# Patient Record
Sex: Female | Born: 1955 | Race: Black or African American | Hispanic: No | Marital: Single | Smoking: Never smoker
Health system: Southern US, Community
[De-identification: ages and names within clinical notes are randomized; demographics above are authoritative.]

## PROBLEM LIST (undated history)

## (undated) DIAGNOSIS — I10 Essential (primary) hypertension: Secondary | ICD-10-CM

## (undated) DIAGNOSIS — E119 Type 2 diabetes mellitus without complications: Secondary | ICD-10-CM

## (undated) HISTORY — PX: CHOLECYSTECTOMY: SHX55

## (undated) HISTORY — DX: Type 2 diabetes mellitus without complications: E11.9

## (undated) HISTORY — PX: ABDOMINAL HYSTERECTOMY: SHX81

## (undated) HISTORY — DX: Essential (primary) hypertension: I10

---

## 2007-06-25 ENCOUNTER — Other Ambulatory Visit: Admission: RE | Admit: 2007-06-25 | Discharge: 2007-06-25 | Payer: Self-pay | Admitting: Internal Medicine

## 2007-06-25 ENCOUNTER — Encounter: Payer: Self-pay | Admitting: Internal Medicine

## 2007-06-25 ENCOUNTER — Ambulatory Visit: Payer: Self-pay | Admitting: Internal Medicine

## 2007-06-25 DIAGNOSIS — J309 Allergic rhinitis, unspecified: Secondary | ICD-10-CM | POA: Insufficient documentation

## 2007-06-25 DIAGNOSIS — I890 Lymphedema, not elsewhere classified: Secondary | ICD-10-CM | POA: Insufficient documentation

## 2007-06-25 DIAGNOSIS — I10 Essential (primary) hypertension: Secondary | ICD-10-CM | POA: Insufficient documentation

## 2008-02-24 ENCOUNTER — Emergency Department (HOSPITAL_BASED_OUTPATIENT_CLINIC_OR_DEPARTMENT_OTHER): Admission: EM | Admit: 2008-02-24 | Discharge: 2008-02-24 | Payer: Self-pay | Admitting: Emergency Medicine

## 2009-04-27 ENCOUNTER — Encounter (INDEPENDENT_AMBULATORY_CARE_PROVIDER_SITE_OTHER): Payer: Self-pay | Admitting: *Deleted

## 2009-09-21 ENCOUNTER — Telehealth: Payer: Self-pay | Admitting: Internal Medicine

## 2010-09-29 ENCOUNTER — Encounter: Payer: Self-pay | Admitting: Obstetrics and Gynecology

## 2010-10-06 LAB — CONVERTED CEMR LAB
ALT: 22 units/L (ref 0–35)
AST: 18 units/L (ref 0–37)
Albumin: 3.5 g/dL (ref 3.5–5.2)
Alkaline Phosphatase: 104 units/L (ref 39–117)
BUN: 8 mg/dL (ref 6–23)
Basophils Absolute: 0 10*3/uL (ref 0.0–0.1)
Basophils Relative: 0.4 % (ref 0.0–1.0)
Bilirubin Urine: NEGATIVE
Bilirubin, Direct: 0.2 mg/dL (ref 0.0–0.3)
Blood in Urine, dipstick: NEGATIVE
CO2: 32 meq/L (ref 19–32)
Calcium: 8.8 mg/dL (ref 8.4–10.5)
Chloride: 108 meq/L (ref 96–112)
Cholesterol: 142 mg/dL (ref 0–200)
Creatinine, Ser: 0.8 mg/dL (ref 0.4–1.2)
Eosinophils Absolute: 0.2 10*3/uL (ref 0.0–0.6)
Eosinophils Relative: 2 % (ref 0.0–5.0)
GFR calc Af Amer: 97 mL/min
GFR calc non Af Amer: 80 mL/min
Glucose, Bld: 117 mg/dL — ABNORMAL HIGH (ref 70–99)
Glucose, Urine, Semiquant: NEGATIVE
HCT: 39 % (ref 36.0–46.0)
HDL: 41.2 mg/dL (ref >39.0)
Hemoglobin: 13.1 g/dL (ref 12.0–15.0)
Ketones, urine, test strip: NEGATIVE
LDL Cholesterol: 84 mg/dL (ref 0–99)
Lymphocytes Relative: 27.4 % (ref 12.0–46.0)
MCHC: 33.7 g/dL (ref 30.0–36.0)
MCV: 82.5 fL (ref 78.0–100.0)
Monocytes Absolute: 0.3 10*3/uL (ref 0.2–0.7)
Monocytes Relative: 4.3 % (ref 3.0–11.0)
Neutro Abs: 5.1 10*3/uL (ref 1.4–7.7)
Neutrophils Relative %: 65.9 % (ref 43.0–77.0)
Nitrite: NEGATIVE
Platelets: 263 10*3/uL (ref 150–400)
Potassium: 4 meq/L (ref 3.5–5.1)
RBC: 4.72 M/uL (ref 3.87–5.11)
RDW: 13.2 % (ref 11.5–14.6)
Sodium: 144 meq/L (ref 135–145)
Specific Gravity, Urine: 1.01
TSH: 1.79 microintl units/mL (ref 0.35–5.50)
Total Bilirubin: 1.1 mg/dL (ref 0.3–1.2)
Total CHOL/HDL Ratio: 3.4
Total Protein: 6.6 g/dL (ref 6.0–8.3)
Triglycerides: 85 mg/dL (ref 0–149)
Urobilinogen, UA: 0.2
VLDL: 17 mg/dL (ref 0–40)
WBC: 7.7 10*3/uL (ref 4.5–10.5)
pH: 5.5

## 2010-10-08 NOTE — Progress Notes (Signed)
Summary: Schedule recalll colon  Phone Note Outgoing Call Call back at [facility] Phone [phone]   Call placed by: Christie Nottingham CMA Duncan Dull),  September 21, 2009 1:28 PM Call placed to: Patient Summary of Call: left message for pt  to call back and schedule recall colonoscopy. Initial call taken by: Christie Nottingham CMA Duncan Dull),  September 21, 2009 1:28 PM  Follow-up for Phone Call        Left message on patients machine to call back.  Follow-up by: Harlow Mares CMA Duncan Dull),  October 03, 2009 9:32 AM  Additional Follow-up for Phone Call Additional follow up Details #1::        Pt returned call and had her colonoscopy done at [facility] on 08-12-09 Additional Follow-up by: Karna Christmas [employer]    Additional Follow-up for Phone Call Additional follow up Details #2::    I will have Lady Gary put a note in [facility] Follow-up by: Harlow Mares CMA Duncan Dull),  October 03, 2009 4:58 PM

## 2010-10-08 NOTE — Letter (Signed)
Summary: Recall Colonoscopy Letter  [facility]  [address] [place], [place] [address]   Phone: [phone]  Fax: [phone]      April 27, 2009 MRN: [id number]   Kaitlin Boyer [address] [place], [place]  [address]   Dear [name],   According to your medical record, it is time for you to schedule a Colonoscopy. The American Cancer Society recommends this procedure as a method to detect early colon cancer. Patients with a family history of colon cancer, or a personal history of colon polyps or inflammatory bowel disease are at increased risk.  This letter has beeen generated based on the recommendations made at the time of your procedure. If you feel that in your particular situation this may no longer apply, please contact our office.  Please call our office at [phone] to schedule this appointment or to update your records at your earliest convenience.  Thank you for cooperating with [place] to provide you with the very best care possible.   Sincerely,  Hedwig Morton. Juanda Chance, M.D.   [facility] [phone]

## 2010-10-08 NOTE — Letter (Signed)
Summary: patient history  patient history   Imported By: Kassie Mends 06/28/2007 11:20:39  _____________________________________________________________________  External Attachment:    Type:   Image     Comment:   patient history

## 2010-10-08 NOTE — Assessment & Plan Note (Signed)
Summary: pt will come in fasting/cpx/pap/njr new pt   Vital Signs:  Patient Profile:   55 Years Old Female Height:     72.5 inches Weight:      257 pounds BMI:     34.50 Temp:     98.2 degrees F Pulse rate:   72 / minute BP sitting:   158 / 778  Vitals Entered By: Sindy Guadeloupe RN (June 25, 2007 9:20 AM)                 Serial Vital Signs/Assessments:  Time      Position  BP       Pulse  Resp  Temp     By                     148/80                         Kaitlin Boyer   Chief Complaint:  cpx with pap--new pt to est.  History of Present Illness: Kaitlin Boyer comes in today for a first time visit for a check upand to establish.  She knows her weight is a problem and has gained weight over the last year. She feels generally well.  See data base. has hx of high bp and on a med a while back that caused bladder issue.  her bp has been ok most of the time but has not checked readings recently.   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    Current Allergies (reviewed today): No known allergies   Past Medical History:    Allergic rhinitis    Hypertension    lymphedema    g1p1  Past Surgical History:    Cholecystectomy05    Hysterectomypartial 98 /? supracervicalfor fibroids    colonoscopy 03   Family History:    Family History Hypertension both parents    gf stroke  Social History:    Engineer, building services    Single    Never Smoked    Alcohol use-yesocassional    lives with mom no pets    caffiend coke 3-4 per week   Risk Factors:  Tobacco use:  never Alcohol use:  yes   Review of Systems  The patient denies anorexia, fever, chest pain, syncope, prolonged cough, abdominal pain, and hematuria.         as per hpi   Physical Exam  General:     alert, well-developed, well-nourished, and well-hydrated.   Head:     normocephalic and atraumatic.   Eyes:     vision grossly intact, pupils equal, pupils round, and  pupils reactive to light.   Ears:     R ear normal and L ear normal.   Nose:     no nasal discharge.   Mouth:     pharynx pink and moist.   Neck:     supple, full ROM, and no masses.   Breasts:     No mass, nodules, thickening, tenderness, bulging, retraction, inflamation, nipple discharge or skin changes noted.   Lungs:     Normal respiratory effort, chest expands symmetrically. Lungs are clear to auscultation, no crackles or wheezes. Heart:     Normal rate and regular rhythm. S1 and S2 normal without gallop, murmur, click, rub or other extra sounds. Abdomen:     Bowel sounds positive,abdomen soft and non-tender without masses, organomegaly or hernias noted.  well healed scars one midline Rectal:     No external abnormalities noted. Normal sphincter tone. No rectal masses or tenderness. Genitalia:     Pelvic Exam:        External: normal female genitalia without lesions or masses        Vagina: normal without lesions or masses        Cervix: normal without lesions or masses        Adnexa: normal bimanual exam without masses or fullness        Uterus: absent        Pap smear: performed exam limited by large abd wall Msk:     No deformity or scoliosis noted of thoracic or lumbar spine.   Pulses:     R and L carotid,radial,femoral,dorsalis pedis and posterior tibial pulses are full and equal bilaterally Extremities:     2+ left pedal edema and 2+ right pedal edema.   Neurologic:     grossly normal  Skin:     turgor normal and color normal.  faint hyperpigmentaiton at neck Cervical Nodes:     No lymphadenopathy noted Axillary Nodes:     No palpable lymphadenopathy Inguinal Nodes:     No significant adenopathy Psych:     normally interactive, good eye contact, not anxious appearing, and not depressed appearing.   Additional Exam:     ekg nsr nspec t wave ant leads, no acute changes    Impression & Recommendations:  Problem # 1:  PREVENTIVE HEALTH CARE (ICD-V70.0) disc  diet and exercise and agree with weight loss Orders: Venipuncture (64403) TLB-Lipid Panel (80061-LIPID) TLB-BMP (Basic Metabolic Panel-BMET) (80048-METABOL) TLB-CBC Platelet - w/Differential (85025-CBCD) TLB-Hepatic/Liver Function Pnl (80076-HEPATIC) TLB-TSH (Thyroid Stimulating Hormone) (84443-TSH) UA Dipstick w/o Micro (81002) Obtaining Screening PAP Smear (Q0091) EKG w/ Interpretation (93000)   Problem # 2:  GYNECOLOGICAL EXAMINATION, ROUTINE (ICD-V72.31) Assessment: Comment Only  Orders: Obtaining Screening PAP Smear (K7425)   Problem # 3:  HYPERTENSION (ICD-401.9) had se prev meds  may need meds  if bp readings up start meds  get bp cuff to monitor Orders: EKG w/ Interpretation (93000)   Problem # 4:  ALLERGIC RHINITIS (ICD-477.9) Assessment: Comment Only  Problem # 5:  LYMPHEDEMA (ICD-457.1) Assessment: Comment Only   Patient Instructions: 1)  if bp over 140 over 3 times then begin lisinopril 20 #30 refill x 3 and rov 1 month either way.    ] Laboratory Results   Urine Tests    Routine Urinalysis   Color: yellow Appearance: Clear Glucose: negative   (Normal Range: Negative) Bilirubin: negative   (Normal Range: Negative) Ketone: negative   (Normal Range: Negative) Spec. Gravity: 1.010   (Normal Range: 1.003-1.035) Blood: negative   (Normal Range: Negative) pH: 5.5   (Normal Range: 5.0-8.0) Protein: trace   (Normal Range: Negative) Urobilinogen: 0.2   (Normal Range: 0-1) Nitrite: negative   (Normal Range: Negative) Leukocyte Esterace: 1+   (Normal Range: Negative)    Comments: ...................................................................Kaitlin Boyer  June 25, 2007 11:52 AM

## 2011-06-05 LAB — POCT I-STAT, CHEM 8
BUN: 10
Calcium, Ion: 1.11 — ABNORMAL LOW
Chloride: 105
Creatinine, Ser: 1
Glucose, Bld: 124 — ABNORMAL HIGH
HCT: 41
Hemoglobin: 13.9
Potassium: 4
Sodium: 140
TCO2: 27

## 2014-02-16 ENCOUNTER — Encounter: Payer: 59 | Attending: Internal Medicine | Admitting: *Deleted

## 2014-02-16 ENCOUNTER — Encounter: Payer: Self-pay | Admitting: *Deleted

## 2014-02-16 VITALS — Ht 72.5 in | Wt 219.8 lb

## 2014-02-16 DIAGNOSIS — Z713 Dietary counseling and surveillance: Secondary | ICD-10-CM | POA: Insufficient documentation

## 2014-02-16 DIAGNOSIS — E119 Type 2 diabetes mellitus without complications: Secondary | ICD-10-CM | POA: Insufficient documentation

## 2014-02-16 NOTE — Patient Instructions (Signed)
Plan:  Aim for 4 Carb Choices per meal (60 grams) +/- 1 either way  Aim for 0-2 Carbs per snack if hungry  Include protein in moderation with your meals and snacks Consider reading food labels for Total Carbohydrate of foods Consider  increasing your activity level by walking as tolerated

## 2014-02-16 NOTE — Progress Notes (Signed)
Appt start time: 1500 end time:  1630.  Assessment:  Patient was seen on  02/16/2014 for individual diabetes education. Lives with her 60 year old mother, patient does the cooking for both. She has a meter, has not used it yet. She works flex hours for [employer], working 50 hours a week. She also has her own travel business  Enjoys travel. Newly diagnosed with diabetes.  Current HbA1c: 10.3%  Preferred Learning Style:   No preference indicated   Learning Readiness:   Ready  Change in progress  MEDICATIONS: see list, diabetes medication is Metformin  DIETARY INTAKE: Avoids pork and most sodas  24-hr recall:  B ( AM): skips occasionally, fresh fruit OR bagel with cream cheese, water Snk ( AM): varies - more fresh fruit  L ( PM): buys in cafe at work: hot meal with extra vegetables, bread, smaller meat portions, water Snk ( PM):  D ( PM): enjoys beans with dinner, vegetables, bread, again small portions of meat Snk ( PM): not usually unless bowl of ice cream Beverages: water, lite fruit juices  Usual physical activity: not recently, just had sinus infection late last year, works long hours  Estimated energy needs: 1600 calories 180 g carbohydrates 120 g protein 44 g fat  Progress Towards Goal(s):  In progress.   Nutritional Diagnosis:  NB-1.1 Food and nutrition-related knowledge deficit As related to diabetes.  As evidenced by A1c of 10.3%.    Intervention:  Nutrition counseling provided.  Discussed diabetes disease process and treatment options.  Discussed physiology of diabetes and role of obesity on insulin resistance.  Encouraged moderate weight reduction to improve glucose levels.  Discussed role of medications and diet in glucose control  Provided education on macronutrients on glucose levels.  Provided education on carb counting, importance of regularly scheduled meals/snacks, and meal planning  Discussed effects of physical activity on glucose levels and  long-term glucose control.  Recommended 150 minutes of physical activity/week.  Reviewed patient medications.  Discussed role of medication on blood glucose and possible side effects  Discussed blood glucose monitoring and interpretation.  Discussed recommended target ranges and individual ranges.    Described short-term complications: hyper- and hypo-glycemia.  Discussed causes,symptoms, and treatment options.  Discussed prevention, detection, and treatment of long-term complications.  Discussed the role of prolonged elevated glucose levels on body systems.  Discussed role of stress on blood glucose levels and discussed strategies to manage psychosocial issues.  Discussed recommendations for long-term diabetes self-care.  Provided checklist for medical, dental, and emotional self-care.  Plan:  Aim for 4 Carb Choices per meal (60 grams) +/- 1 either way  Aim for 0-2 Carbs per snack if hungry  Include protein in moderation with your meals and snacks Consider reading food labels for Total Carbohydrate of foods Consider  increasing your activity level by walking as tolerated  Teaching Method Utilized: Visual, Auditory and Hands on  Handouts given during visit include: Living Well with Diabetes Carb Counting and Food Label handouts Meal Plan Card  Barriers to learning/adherence to lifestyle change: busy work schedule and caring for elderly mother  Diabetes self-care support plan:   [facility] support group available  Demonstrated degree of understanding via:  Teach Back   Monitoring/Evaluation:  Dietary intake, exercise, reading food labels, and body weight prn.

## 2020-07-28 ENCOUNTER — Emergency Department (HOSPITAL_BASED_OUTPATIENT_CLINIC_OR_DEPARTMENT_OTHER)
Admission: EM | Admit: 2020-07-28 | Discharge: 2020-07-28 | Disposition: A | Discharge status: Home / Self Care | Payer: Medicare HMO | Attending: Emergency Medicine | Admitting: Emergency Medicine

## 2020-07-28 ENCOUNTER — Other Ambulatory Visit: Payer: Self-pay

## 2020-07-28 ENCOUNTER — Encounter (HOSPITAL_BASED_OUTPATIENT_CLINIC_OR_DEPARTMENT_OTHER): Payer: Self-pay

## 2020-07-28 ENCOUNTER — Emergency Department (HOSPITAL_BASED_OUTPATIENT_CLINIC_OR_DEPARTMENT_OTHER): Payer: Medicare HMO

## 2020-07-28 DIAGNOSIS — R42 Dizziness and giddiness: Secondary | ICD-10-CM | POA: Diagnosis present

## 2020-07-28 DIAGNOSIS — E119 Type 2 diabetes mellitus without complications: Secondary | ICD-10-CM | POA: Diagnosis not present

## 2020-07-28 DIAGNOSIS — I10 Essential (primary) hypertension: Secondary | ICD-10-CM | POA: Diagnosis not present

## 2020-07-28 LAB — URINALYSIS, MICROSCOPIC (REFLEX)

## 2020-07-28 LAB — BASIC METABOLIC PANEL
Anion gap: 10 (ref 5–15)
BUN: 24 mg/dL — ABNORMAL HIGH (ref 8–23)
CO2: 25 mmol/L (ref 22–32)
Calcium: 9.7 mg/dL (ref 8.9–10.3)
Chloride: 103 mmol/L (ref 98–111)
Creatinine, Ser: 1.11 mg/dL — ABNORMAL HIGH (ref 0.44–1.00)
GFR, Estimated: 55 mL/min — ABNORMAL LOW (ref >60)
Glucose, Bld: 213 mg/dL — ABNORMAL HIGH (ref 70–99)
Potassium: 4.5 mmol/L (ref 3.5–5.1)
Sodium: 138 mmol/L (ref 135–145)

## 2020-07-28 LAB — CBC
HCT: 38.8 % (ref 36.0–46.0)
Hemoglobin: 13.1 g/dL (ref 12.0–15.0)
MCH: 27.5 pg (ref 26.0–34.0)
MCHC: 33.8 g/dL (ref 30.0–36.0)
MCV: 81.5 fL (ref 80.0–100.0)
Platelets: 269 10*3/uL (ref 150–400)
RBC: 4.76 MIL/uL (ref 3.87–5.11)
RDW: 12.7 % (ref 11.5–15.5)
WBC: 10.4 10*3/uL (ref 4.0–10.5)
nRBC: 0 % (ref 0.0–0.2)

## 2020-07-28 LAB — URINALYSIS, ROUTINE W REFLEX MICROSCOPIC
Bilirubin Urine: NEGATIVE
Glucose, UA: NEGATIVE mg/dL
Ketones, ur: NEGATIVE mg/dL
Nitrite: NEGATIVE
Protein, ur: 100 mg/dL — AB
Specific Gravity, Urine: 1.02 (ref 1.005–1.030)
pH: 6 (ref 5.0–8.0)

## 2020-07-28 LAB — CBG MONITORING, ED: Glucose-Capillary: 194 mg/dL — ABNORMAL HIGH (ref 70–99)

## 2020-07-28 MED ORDER — MECLIZINE HCL 25 MG PO TABS: 25.0000 mg | ORAL_TABLET | Freq: Three times a day (TID) | ORAL | 0 refills | Status: DC | PRN

## 2020-07-28 MED ORDER — MECLIZINE HCL 25 MG PO TABS
25.0000 mg | ORAL_TABLET | Freq: Once | ORAL | Status: AC
  Administered 2020-07-28: 25 mg via ORAL
  Filled 2020-07-28: qty 1

## 2020-07-28 NOTE — ED Notes (Signed)
Dr. Madilyn Hook, ED Provider at bedside.

## 2020-07-28 NOTE — ED Provider Notes (Signed)
[facility] EMERGENCY DEPARTMENT Provider Note   CSN: [id number] Arrival date & time: 07/28/20  1325     History Chief Complaint  Patient presents with  . Dizziness    Kaitlin Boyer is a 64 y.o. female.  The history is provided by the patient and medical records. No language interpreter was used.  Dizziness  Kaitlin Boyer is a 64 y.o. female who presents to the Emergency Department complaining of dizziness.  She presents to the ED complaining of waxing and waning dizziness that began ten days when she started taking amlodipine.  One week ago she changed her meds to at night instead of in the morning.  She started losartan on Wednesday night.  Today her dizziness lasted longer than other days.  The dizziness is described as foggy headed and heavy in her forehead.  Sxs are constant today, no change with movement.  She feels like she will fall when she walks, so she needs to walk slower.  She also feels like she is going to pass out.  Feels cold.  Denies headache, chest pain, sob, fever, N/V/D, vision changes, numbness, weakness.      Past Medical History:  Diagnosis Date  . Diabetes mellitus without complication (HCC)   . Hypertension     Patient Active Problem List   Diagnosis Date Noted  . HYPERTENSION 06/25/2007  . LYMPHEDEMA 06/25/2007  . ALLERGIC RHINITIS 06/25/2007    Past Surgical History:  Procedure Laterality Date  . ABDOMINAL HYSTERECTOMY    . CHOLECYSTECTOMY       OB History   No obstetric history on file.     No family history on file.  Social History   Tobacco Use  . Smoking status: Never Smoker  . Smokeless tobacco: Never Used  Substance Use Topics  . Alcohol use: Yes    Comment: couple beer or wine per month  . Drug use: Not on file    Home Medications Prior to Admission medications   Medication Sig Start Date End Date Taking? Authorizing Provider  meclizine (ANTIVERT) 25 MG tablet Take 1 tablet (25 mg total) by mouth 3  (three) times daily as needed for dizziness. 07/28/20   Tilden Fossa, MD  metFORMIN (GLUMETZA) 500 MG (MOD) 24 hr tablet Take 500 mg by mouth 2 (two) times daily with a meal.    [provider]    Allergies    Patient has no known allergies.  Review of Systems   Review of Systems  Neurological: Positive for dizziness.  All other systems reviewed and are negative.   Physical Exam Updated Vital Signs BP (!) 183/85   Pulse 70   Temp 97.7 F (36.5 C) (Oral)   Resp 11   LMP  (LMP Unknown)   SpO2 100%   Physical Exam Vitals and nursing note reviewed.  Constitutional:      Appearance: She is well-developed.  HENT:     Head: Normocephalic and atraumatic.  Cardiovascular:     Rate and Rhythm: Normal rate and regular rhythm.     Heart sounds: No murmur heard.   Pulmonary:     Effort: Pulmonary effort is normal. No respiratory distress.     Breath sounds: Normal breath sounds.  Abdominal:     Palpations: Abdomen is soft.     Tenderness: There is no abdominal tenderness. There is no guarding or rebound.  Musculoskeletal:        General: No tenderness.  Skin:    General: Skin  is warm and dry.  Neurological:     Mental Status: She is alert and oriented to person, place, and time.     Comments: No asymmetry of facial movements.  Visual fields grossly intact.  No pronator drift.  5/5 strength in all four extremities with sensation to light touch intact in all four extremities.  No ataxia on FTN bilaterally.    Psychiatric:        Behavior: Behavior normal.     ED Results / Procedures / Treatments   Labs (all labs ordered are listed, but only abnormal results are displayed) Labs Reviewed  BASIC METABOLIC PANEL - Abnormal; Notable for the following components:      Result Value   Glucose, Bld 213 (*)    BUN 24 (*)    Creatinine, Ser 1.11 (*)    GFR, Estimated 55 (*)    All other components within normal limits  URINALYSIS, ROUTINE W REFLEX MICROSCOPIC -  Abnormal; Notable for the following components:   APPearance CLOUDY (*)    Hgb urine dipstick TRACE (*)    Protein, ur 100 (*)    Leukocytes,Ua MODERATE (*)    All other components within normal limits  URINALYSIS, MICROSCOPIC (REFLEX) - Abnormal; Notable for the following components:   Bacteria, UA MANY (*)    All other components within normal limits  CBG MONITORING, ED - Abnormal; Notable for the following components:   Glucose-Capillary 194 (*)    All other components within normal limits  CBC    EKG EKG Interpretation  Date/Time:  Saturday July 28 2020 13:46:16 EST Ventricular Rate:  85 PR Interval:  144 QRS Duration: 76 QT Interval:  356 QTC Calculation: 423 R Axis:   16 Text Interpretation: Normal sinus rhythm Right atrial enlargement Nonspecific ST and T wave abnormality Abnormal ECG no prior available for comparison Confirmed by Tilden Fossa [phone]) on 07/28/2020 3:22:31 PM   Radiology CT Head Wo Contrast  Result Date: 07/28/2020 CLINICAL DATA:  Central vertigo EXAM: CT HEAD WITHOUT CONTRAST TECHNIQUE: Contiguous axial images were obtained from the base of the skull through the vertex without intravenous contrast. COMPARISON:  None. FINDINGS: Brain: No evidence of acute territorial infarction, hemorrhage, hydrocephalus,extra-axial collection or mass lesion/mass effect. There is dilatation the ventricles and sulci consistent with age-related atrophy. Low-attenuation changes in the deep white matter consistent with small vessel ischemia. Vascular: No hyperdense vessel or unexpected calcification. Skull: The skull is intact. No fracture or focal lesion identified. Sinuses/Orbits: The visualized paranasal sinuses and mastoid air cells are clear. The orbits and globes intact. Other: None IMPRESSION: No acute intracranial abnormality. Findings consistent with age related atrophy and chronic small vessel ischemia Electronically Signed   By: Jonna Clark M.D.   On: 07/28/2020  16:24    Procedures Procedures (including critical care time)  Medications Ordered in ED Medications  meclizine (ANTIVERT) tablet 25 mg (25 mg Oral Given 07/28/20 1550)    ED Course  I have reviewed the triage vital signs and the nursing notes.  Pertinent labs & imaging results that were available during my care of the patient were reviewed by me and considered in my medical decision making (see chart for details).    MDM Rules/Calculators/A&P                         patient with history of hypertension here for evaluation of dizziness for 10 days. She is non-toxic appearing on evaluation with no focal neurologic  deficits. She is hypertensive on ED evaluation. No evidence of acute organ dysfunction on exam. CT head with chronic changes. UA not consistent with UTI. Presentation is not consistent with hypertensive emergency. Discussed with patient home care for fatigue and dizziness. Discussed PCP follow-up and return precautions.  Final Clinical Impression(s) / ED Diagnoses Final diagnoses:  Dizziness  Essential hypertension    Rx / DC Orders ED Discharge Orders         Ordered    meclizine (ANTIVERT) 25 MG tablet  3 times daily PRN        07/28/20 1730           Tilden Fossa, MD 07/28/20 1734

## 2020-07-28 NOTE — Discharge Instructions (Addendum)
The cause of your symptoms was not identified today. Please continue your current medications as prescribed. Get rechecked immediately if you develop numbness, weakness or new concerning symptoms.

## 2020-07-28 NOTE — ED Notes (Signed)
Patient transported to CT 

## 2020-07-28 NOTE — ED Notes (Signed)
ED Provider at bedside. 

## 2020-07-28 NOTE — ED Triage Notes (Signed)
She c/o persistent and gradually increasing "dizziness" since having been placed on Amlodipine and Losartan ~ 10 days ago. She is ambulatory, alert and in no distress.

## 2021-06-14 ENCOUNTER — Emergency Department (HOSPITAL_BASED_OUTPATIENT_CLINIC_OR_DEPARTMENT_OTHER)
Admission: EM | Admit: 2021-06-14 | Discharge: 2021-06-14 | Disposition: A | Discharge status: Home / Self Care | Payer: Medicare HMO | Attending: Emergency Medicine | Admitting: Emergency Medicine

## 2021-06-14 ENCOUNTER — Other Ambulatory Visit: Payer: Self-pay

## 2021-06-14 ENCOUNTER — Emergency Department (HOSPITAL_BASED_OUTPATIENT_CLINIC_OR_DEPARTMENT_OTHER): Payer: Medicare HMO

## 2021-06-14 ENCOUNTER — Encounter (HOSPITAL_BASED_OUTPATIENT_CLINIC_OR_DEPARTMENT_OTHER): Payer: Self-pay | Admitting: *Deleted

## 2021-06-14 DIAGNOSIS — E119 Type 2 diabetes mellitus without complications: Secondary | ICD-10-CM | POA: Insufficient documentation

## 2021-06-14 DIAGNOSIS — Z7984 Long term (current) use of oral hypoglycemic drugs: Secondary | ICD-10-CM | POA: Insufficient documentation

## 2021-06-14 DIAGNOSIS — I10 Essential (primary) hypertension: Secondary | ICD-10-CM | POA: Insufficient documentation

## 2021-06-14 DIAGNOSIS — R42 Dizziness and giddiness: Secondary | ICD-10-CM | POA: Diagnosis present

## 2021-06-14 MED ORDER — MECLIZINE HCL 25 MG PO TABS
25.0000 mg | ORAL_TABLET | Freq: Once | ORAL | Status: AC
  Administered 2021-06-14: 25 mg via ORAL
  Filled 2021-06-14: qty 1

## 2021-06-14 MED ORDER — MECLIZINE HCL 25 MG PO TABS: 25.0000 mg | ORAL_TABLET | Freq: Three times a day (TID) | ORAL | 0 refills | Status: DC | PRN

## 2021-06-14 NOTE — ED Notes (Signed)
Pt ambulated to restroom independently.

## 2021-06-14 NOTE — ED Provider Notes (Signed)
[facility] EMERGENCY DEPARTMENT Provider Note   CSN: [id number] Arrival date & time: 06/14/21  1825     History Chief Complaint  Patient presents with   Dizziness    Kaitlin Boyer is a 65 y.o. female.  Patient is a 65 year old female with history of hypertension and diabetes presenting today with complaint of dizziness.  She notes over the last 2 weeks she has been seeing her doctor at [facility] and reported that she had been on lisinopril for approximately 2 months and her blood pressure had seemed to be improving but when she went and saw her doctor 2 weeks ago her blood pressure was still elevated and they started her on a new medication.  In the meantime she started experiencing dizziness that she describes as a room spinning and worse when she tries to stand and walk and sometimes she has to steady herself on the wall.  She has no nausea vomiting, headache, tinnitus, neck pain.  She denies any trauma.  She feels like it was related to her lisinopril.  She was telling her doctor this and when she was there they started her on an additional blood pressure medication and she was taking both of them but the spinning became worse and she has discontinued them 2 days ago and has not taken them any further.  She called her doctor's office yesterday and today and they finally got back with her this afternoon and they reported she should go to the emergency room.  She denies any unilateral numbness, weakness, visual complaints.  No speech difficulty or trouble swallowing.  She is otherwise been in her normal state of health.  The history is provided by the patient.  Dizziness Quality:  Imbalance and room spinning Severity:  Moderate Onset quality:  Sudden Duration:  2 weeks Timing:  Intermittent Progression:  Waxing and waning Chronicity:  Recurrent Context comment:  Noticed with standing and movement but cannot think of a specific event Relieved by:  Being still Worsened  by:  Movement and sitting upright (Walking) Ineffective treatments:  None tried Associated symptoms: no chest pain, no headaches, no hearing loss, no nausea, no palpitations, no shortness of breath, no syncope, no tinnitus, no vision changes, no vomiting and no weakness   Risk factors comment:  Prior history of dizziness that she did receive meclizine for about a year ago.  History of diabetes and hypertension     Past Medical History:  Diagnosis Date   Diabetes mellitus without complication (HCC)    Hypertension     Patient Active Problem List   Diagnosis Date Noted   HYPERTENSION 06/25/2007   LYMPHEDEMA 06/25/2007   ALLERGIC RHINITIS 06/25/2007    Past Surgical History:  Procedure Laterality Date   ABDOMINAL HYSTERECTOMY     CHOLECYSTECTOMY       OB History   No obstetric history on file.     History reviewed. No pertinent family history.  Social History   Tobacco Use   Smoking status: Never   Smokeless tobacco: Never  Substance Use Topics   Alcohol use: Yes    Comment: couple beer or wine per month    Home Medications Prior to Admission medications   Medication Sig Start Date End Date Taking? Authorizing Provider  meclizine (ANTIVERT) 25 MG tablet Take 1 tablet (25 mg total) by mouth 3 (three) times daily as needed for dizziness. 06/14/21   Gwyneth Sprout, MD  metFORMIN (GLUMETZA) 500 MG (MOD) 24 hr tablet Take 500  mg by mouth 2 (two) times daily with a meal.    [provider]    Allergies    Patient has no known allergies.  Review of Systems   Review of Systems  HENT:  Negative for hearing loss and tinnitus.   Respiratory:  Negative for shortness of breath.   Cardiovascular:  Negative for chest pain, palpitations and syncope.  Gastrointestinal:  Negative for nausea and vomiting.  Neurological:  Positive for dizziness. Negative for weakness and headaches.  All other systems reviewed and are negative.  Physical Exam Updated Vital Signs BP  (!) 189/83   Pulse 79   Temp 98.9 F (37.2 C) (Oral)   Resp 20   Ht 6' 0.5" (1.842 m)   Wt 92.1 kg   LMP  (LMP Unknown)   SpO2 100%   BMI 27.15 kg/m   Physical Exam Vitals and nursing note reviewed.  Constitutional:      General: She is not in acute distress.    Appearance: She is well-developed.  HENT:     Head: Normocephalic and atraumatic.  Eyes:     General: No visual field deficit.    Pupils: Pupils are equal, round, and reactive to light.  Cardiovascular:     Rate and Rhythm: Normal rate and regular rhythm.     Heart sounds: Normal heart sounds. No murmur heard.   No friction rub.  Pulmonary:     Effort: Pulmonary effort is normal.     Breath sounds: Normal breath sounds. No wheezing or rales.  Abdominal:     General: Bowel sounds are normal. There is no distension.     Palpations: Abdomen is soft.     Tenderness: There is no abdominal tenderness. There is no guarding or rebound.  Musculoskeletal:        General: No tenderness. Normal range of motion.     Cervical back: Normal range of motion and neck supple. No tenderness.     Right lower leg: No edema.     Left lower leg: No edema.     Comments: No edema  Skin:    General: Skin is warm and dry.     Findings: No rash.  Neurological:     Mental Status: She is alert and oriented to person, place, and time. Mental status is at baseline.     Cranial Nerves: No cranial nerve deficit, dysarthria or facial asymmetry.     Sensory: Sensation is intact. No sensory deficit.     Motor: Motor function is intact. No weakness or pronator drift.     Coordination: Coordination is intact.     Gait: Gait is intact.     Comments: No notable nystagmus  Psychiatric:        Behavior: Behavior normal.    ED Results / Procedures / Treatments   Labs (all labs ordered are listed, but only abnormal results are displayed) Labs Reviewed - No data to display  EKG None  Radiology CT Head Wo Contrast  Result Date:  06/14/2021 CLINICAL DATA:  Dizziness EXAM: CT HEAD WITHOUT CONTRAST TECHNIQUE: Contiguous axial images were obtained from the base of the skull through the vertex without intravenous contrast. COMPARISON:  07/28/2020 FINDINGS: Brain: Normal anatomic configuration. Parenchymal volume loss is commensurate with the patient's age. Stable mild periventricular white matter changes are present likely reflecting the sequela of small vessel ischemia. No abnormal intra or extra-axial mass lesion or fluid collection. No abnormal mass effect or midline shift. No evidence of acute  intracranial hemorrhage or infarct. Ventricular size is normal. Cerebellum unremarkable. Vascular: No asymmetric hyperdense vasculature at the skull base. Skull: Intact Sinuses/Orbits: Paranasal sinuses are clear. Orbits are unremarkable. Other: Mastoid air cells and middle ear cavities are clear. IMPRESSION: No acute intracranial abnormality.  Stable senescent change. Electronically Signed   By: Helyn Numbers M.D.   On: 06/14/2021 20:08    Procedures Procedures   Medications Ordered in ED Medications  meclizine (ANTIVERT) tablet 25 mg (25 mg Oral Given 06/14/21 2037)    ED Course  I have reviewed the triage vital signs and the nursing notes.  Pertinent labs & imaging results that were available during my care of the patient were reviewed by me and considered in my medical decision making (see chart for details).    MDM Rules/Calculators/A&P                           Pt with sx most consistent with peripheral vertigo.  No systemic or infectious sx.  Normal neuro exam without weakness, ataxia or cerebellar findings on exam.  Normal vision.  Sx are reproducible with movement of the head and attempting to walk.  No hx of Stroke.  Patient is hypertensive here however she has not taken her blood pressure medication in the last 2 days and is most likely a result of that and not from stroke.  Head CT without acute findings including  space-occupying lesion, increased intracranial pressure.  After meclizine patient was feeling better.  She is ambulating in the department with no ataxia.  Encouraged her to at least start back one of the blood pressure medications and the follow-up with her doctor on Monday MDM   Amount and/or Complexity of Data Reviewed Tests in the radiology section of CPT: ordered and reviewed Independent visualization of images, tracings, or specimens: yes     Final Clinical Impression(s) / ED Diagnoses Final diagnoses:  Dizziness    Rx / DC Orders ED Discharge Orders          Ordered    meclizine (ANTIVERT) 25 MG tablet  3 times daily PRN        06/14/21 2128             Gwyneth Sprout, MD 06/14/21 2136

## 2021-06-14 NOTE — ED Notes (Signed)
Pt states that her dizziness is much better. Pt walked out independently

## 2021-06-14 NOTE — ED Triage Notes (Signed)
C/o dizziness x 1 week  and increased BP

## 2021-06-14 NOTE — Discharge Instructions (Signed)
Restart the new blood pressure pill.  CAT scan looked good today.  Otherwise you can try the meclizine when you have the dizziness.

## 2021-06-14 NOTE — ED Notes (Signed)
Patient transported to CT 

## 2022-01-27 ENCOUNTER — Emergency Department (HOSPITAL_BASED_OUTPATIENT_CLINIC_OR_DEPARTMENT_OTHER)
Admission: EM | Admit: 2022-01-27 | Discharge: 2022-01-27 | Disposition: A | Discharge status: Home / Self Care | Payer: Medicare HMO | Attending: Emergency Medicine | Admitting: Emergency Medicine

## 2022-01-27 ENCOUNTER — Other Ambulatory Visit: Payer: Self-pay

## 2022-01-27 ENCOUNTER — Emergency Department (HOSPITAL_BASED_OUTPATIENT_CLINIC_OR_DEPARTMENT_OTHER): Payer: Medicare HMO

## 2022-01-27 ENCOUNTER — Encounter (HOSPITAL_BASED_OUTPATIENT_CLINIC_OR_DEPARTMENT_OTHER): Payer: Self-pay | Admitting: Emergency Medicine

## 2022-01-27 DIAGNOSIS — E1165 Type 2 diabetes mellitus with hyperglycemia: Secondary | ICD-10-CM | POA: Diagnosis not present

## 2022-01-27 DIAGNOSIS — R0602 Shortness of breath: Secondary | ICD-10-CM | POA: Diagnosis present

## 2022-01-27 DIAGNOSIS — I10 Essential (primary) hypertension: Secondary | ICD-10-CM | POA: Insufficient documentation

## 2022-01-27 DIAGNOSIS — J189 Pneumonia, unspecified organism: Secondary | ICD-10-CM | POA: Insufficient documentation

## 2022-01-27 DIAGNOSIS — R944 Abnormal results of kidney function studies: Secondary | ICD-10-CM | POA: Diagnosis not present

## 2022-01-27 DIAGNOSIS — Z20822 Contact with and (suspected) exposure to covid-19: Secondary | ICD-10-CM | POA: Insufficient documentation

## 2022-01-27 DIAGNOSIS — R7989 Other specified abnormal findings of blood chemistry: Secondary | ICD-10-CM | POA: Insufficient documentation

## 2022-01-27 DIAGNOSIS — Z7984 Long term (current) use of oral hypoglycemic drugs: Secondary | ICD-10-CM | POA: Diagnosis not present

## 2022-01-27 LAB — CBC WITH DIFFERENTIAL/PLATELET
Abs Immature Granulocytes: 0.03 10*3/uL (ref 0.00–0.07)
Basophils Absolute: 0.1 10*3/uL (ref 0.0–0.1)
Basophils Relative: 1 %
Eosinophils Absolute: 0.2 10*3/uL (ref 0.0–0.5)
Eosinophils Relative: 3 %
HCT: 36 % (ref 36.0–46.0)
Hemoglobin: 12.1 g/dL (ref 12.0–15.0)
Immature Granulocytes: 0 %
Lymphocytes Relative: 27 %
Lymphs Abs: 2.5 10*3/uL (ref 0.7–4.0)
MCH: 27.5 pg (ref 26.0–34.0)
MCHC: 33.6 g/dL (ref 30.0–36.0)
MCV: 81.8 fL (ref 80.0–100.0)
Monocytes Absolute: 0.4 10*3/uL (ref 0.1–1.0)
Monocytes Relative: 5 %
Neutro Abs: 5.9 10*3/uL (ref 1.7–7.7)
Neutrophils Relative %: 64 %
Platelets: 294 10*3/uL (ref 150–400)
RBC: 4.4 MIL/uL (ref 3.87–5.11)
RDW: 13.9 % (ref 11.5–15.5)
WBC: 9.1 10*3/uL (ref 4.0–10.5)
nRBC: 0 % (ref 0.0–0.2)

## 2022-01-27 LAB — RESP PANEL BY RT-PCR (FLU A&B, COVID) ARPGX2
Influenza A by PCR: NEGATIVE
Influenza B by PCR: NEGATIVE
SARS Coronavirus 2 by RT PCR: NEGATIVE

## 2022-01-27 LAB — BASIC METABOLIC PANEL
Anion gap: 4 — ABNORMAL LOW (ref 5–15)
BUN: 24 mg/dL — ABNORMAL HIGH (ref 8–23)
CO2: 29 mmol/L (ref 22–32)
Calcium: 8.9 mg/dL (ref 8.9–10.3)
Chloride: 104 mmol/L (ref 98–111)
Creatinine, Ser: 1.39 mg/dL — ABNORMAL HIGH (ref 0.44–1.00)
GFR, Estimated: 42 mL/min — ABNORMAL LOW (ref >60)
Glucose, Bld: 258 mg/dL — ABNORMAL HIGH (ref 70–99)
Potassium: 4.4 mmol/L (ref 3.5–5.1)
Sodium: 137 mmol/L (ref 135–145)

## 2022-01-27 MED ORDER — AEROCHAMBER Z-STAT PLUS/MEDIUM MISC
1.0000 | Freq: Once | Status: AC
  Administered 2022-01-27: 1
  Filled 2022-01-27: qty 1

## 2022-01-27 MED ORDER — DOXYCYCLINE HYCLATE 100 MG PO CAPS: 100.0000 mg | ORAL_CAPSULE | Freq: Two times a day (BID) | ORAL | 0 refills | Status: DC

## 2022-01-27 MED ORDER — SODIUM CHLORIDE 0.9 % IV SOLN
1.0000 g | Freq: Once | INTRAVENOUS | Status: AC
  Administered 2022-01-27: 1 g via INTRAVENOUS
  Filled 2022-01-27: qty 10

## 2022-01-27 MED ORDER — AZITHROMYCIN 250 MG PO TABS
500.0000 mg | ORAL_TABLET | Freq: Once | ORAL | Status: AC
Start: 2022-01-27 — End: 2022-01-27
  Administered 2022-01-27: 500 mg via ORAL
  Filled 2022-01-27: qty 2

## 2022-01-27 MED ORDER — ALBUTEROL SULFATE HFA 108 (90 BASE) MCG/ACT IN AERS
2.0000 | INHALATION_SPRAY | RESPIRATORY_TRACT | Status: DC | PRN
  Administered 2022-01-27: 2 via RESPIRATORY_TRACT
  Filled 2022-01-27: qty 6.7

## 2022-01-27 MED ORDER — OLMESARTAN MEDOXOMIL-HCTZ 20-12.5 MG PO TABS: 1.0000 | ORAL_TABLET | Freq: Every day | ORAL | 0 refills | Status: DC

## 2022-01-27 MED ORDER — EMPAGLIFLOZIN 10 MG PO TABS: 10.0000 mg | ORAL_TABLET | Freq: Every day | ORAL | 0 refills | Status: DC

## 2022-01-27 NOTE — ED Triage Notes (Signed)
Reports wheezing x 2 weeks , unable to sleep at night . Cough x 2 weeks . Shortness of breath . Denies chest pain

## 2022-01-27 NOTE — Discharge Instructions (Signed)
Start taking your Benicar and Jardiance when you pick it up tomorrow.  Try to avoid salty foods.  Try to eat a low carbohydrate diet.  Try to eat more fruits and vegetables.  Try to avoid processed foods.

## 2022-01-27 NOTE — ED Provider Notes (Signed)
[facility] EMERGENCY DEPARTMENT Provider Note   CSN: [id number] Arrival date & time: 01/27/22  1747     History  Chief Complaint  Patient presents with   Shortness of Breath    Kaitlin Boyer is a 66 y.o. female.  Pt is a 66 yo female with a pmhx significant for dm and htn.  Pt said she has been sob for 2 weeks.  She's had cough and wheezing.  Pt denies fever.        Home Medications Prior to Admission medications   Medication Sig Start Date End Date Taking? Authorizing Provider  doxycycline (VIBRAMYCIN) 100 MG capsule Take 1 capsule (100 mg total) by mouth 2 (two) times daily. 01/27/22  Yes Jacalyn Lefevre, MD  empagliflozin (JARDIANCE) 10 MG TABS tablet Take 1 tablet (10 mg total) by mouth daily before breakfast. 01/27/22  Yes Jacalyn Lefevre, MD  olmesartan-hydrochlorothiazide (BENICAR HCT) 20-12.5 MG tablet Take 1 tablet by mouth daily. 01/27/22  Yes Jacalyn Lefevre, MD  meclizine (ANTIVERT) 25 MG tablet Take 1 tablet (25 mg total) by mouth 3 (three) times daily as needed for dizziness. 06/14/21   Gwyneth Sprout, MD  metFORMIN (GLUMETZA) 500 MG (MOD) 24 hr tablet Take 500 mg by mouth 2 (two) times daily with a meal.    [provider]      Allergies    Patient has no known allergies.    Review of Systems   Review of Systems  Respiratory:  Positive for cough, shortness of breath and wheezing.   All other systems reviewed and are negative.  Physical Exam Updated Vital Signs BP (!) 188/89 (BP Location: Left Arm)   Pulse 81   Temp 98.3 F (36.8 C) (Oral)   Resp (!) 24   Ht 6' 0.5" (1.842 m)   Wt 97.5 kg   LMP  (LMP Unknown)   SpO2 96%   BMI 28.76 kg/m  Physical Exam Vitals and nursing note reviewed.  Constitutional:      Appearance: She is well-developed. She is obese.  HENT:     Head: Normocephalic and atraumatic.     Mouth/Throat:     Mouth: Mucous membranes are moist.     Pharynx: Oropharynx is clear.  Eyes:     Extraocular  Movements: Extraocular movements intact.     Pupils: Pupils are equal, round, and reactive to light.  Cardiovascular:     Rate and Rhythm: Normal rate and regular rhythm.  Pulmonary:     Breath sounds: Wheezing present.  Abdominal:     General: Bowel sounds are normal.     Palpations: Abdomen is soft.  Musculoskeletal:        General: Normal range of motion.     Cervical back: Normal range of motion and neck supple.  Skin:    General: Skin is warm.     Capillary Refill: Capillary refill takes less than 2 seconds.  Neurological:     General: No focal deficit present.     Mental Status: She is alert and oriented to person, place, and time.  Psychiatric:        Mood and Affect: Mood normal.        Behavior: Behavior normal.    ED Results / Procedures / Treatments   Labs (all labs ordered are listed, but only abnormal results are displayed) Labs Reviewed  BASIC METABOLIC PANEL - Abnormal; Notable for the following components:      Result Value   Glucose, Bld 258 (*)  BUN 24 (*)    Creatinine, Ser 1.39 (*)    GFR, Estimated 42 (*)    Anion gap 4 (*)    All other components within normal limits  RESP PANEL BY RT-PCR (FLU A&B, COVID) ARPGX2  CBC WITH DIFFERENTIAL/PLATELET    EKG None  Radiology DG Chest 2 View  Result Date: 01/27/2022 CLINICAL DATA:  Shortness of breath EXAM: CHEST - 2 VIEW COMPARISON:  None Available. FINDINGS: Transverse diameter of heart is within normal limits. There are no signs of alveolar pulmonary edema. Small bilateral pleural effusions are seen. Increased markings are seen in Boyer lower lung fields. There is no pneumothorax. IMPRESSION: Increased markings in Boyer lower lung fields suggest atelectasis/pneumonia. Small bilateral pleural effusions. Electronically Signed   By: Ernie Avena M.D.   On: 01/27/2022 18:29    Procedures Procedures    Medications Ordered in ED Medications  albuterol (VENTOLIN HFA) 108 (90 Base) MCG/ACT inhaler 2  puff (2 puffs Inhalation Given 01/27/22 2012)  cefTRIAXone (ROCEPHIN) 1 g in sodium chloride 0.9 % 100 mL IVPB (0 g Intravenous Stopped 01/27/22 2041)  azithromycin (ZITHROMAX) tablet 500 mg (500 mg Oral Given 01/27/22 2009)  aerochamber Z-Stat Plus/medium 1 each (1 each Other Given 01/27/22 2000)    ED Course/ Medical Decision Making/ A&P                           Medical Decision Making Amount and/or Complexity of Data Reviewed Labs: ordered. Radiology: ordered.  Risk Prescription drug management.   This patient presents to the ED for concern of sob, this involves an extensive number of treatment options, and is a complaint that carries with it a high risk of complications and morbidity.  The differential diagnosis includes pna, uri, covid/flu, bronchitis   Co morbidities that complicate the patient evaluation  Dm and htn   Additional history obtained:  Additional history obtained from epic chart review  Lab Tests:  I Ordered, and personally interpreted labs.  The pertinent results include:  covid/flu neg; cbc nl; bmp with glucose elevated at 258, bun 24 and Cr 1.39.   Imaging Studies ordered:  I ordered imaging studies including cxr  I independently visualized and interpreted imaging which showed    IMPRESSION:  Increased markings in Boyer lower lung fields suggest  atelectasis/pneumonia. Small bilateral pleural effusions.   I agree with the radiologist interpretation   Cardiac Monitoring:  The patient was maintained on a cardiac monitor.  I personally viewed and interpreted the cardiac monitored which showed an underlying rhythm of: nsr   Medicines ordered and prescription drug management:  I ordered medication including rocephin, zithromax, and albuterol  for pna a nd wheezing  Reevaluation of the patient after these medicines showed that the patient improved I have reviewed the patients home medicines and have made adjustments as needed   Problem List / ED  Course:  CAP on CXR:  pt given Rocephin and Zithromax.  She is not hypoxic.   HTN:  pt has been off her Benicar for several months.  Pt has a pcp appt in 2 weeks.  Pt will be given a rx of Benicar. DM:  pt has also been off her Jardiance for months as well.  Rx for Jardiance given.   Reevaluation:  After the interventions noted above, I reevaluated the patient and found that they have :improved   Social Determinants of Health:  Lives at home   Dispostion:  After  consideration of the diagnostic results and the patients response to treatment, I feel that the patent would benefit from discharge with outpatient f/u.          Final Clinical Impression(s) / ED Diagnoses Final diagnoses:  Community acquired pneumonia, unspecified laterality  Hypertension, unspecified type  Hyperglycemia due to diabetes mellitus (HCC)    Rx / DC Orders ED Discharge Orders          Ordered    empagliflozin (JARDIANCE) 10 MG TABS tablet  Daily before breakfast        01/27/22 2057    doxycycline (VIBRAMYCIN) 100 MG capsule  2 times daily        01/27/22 2057    olmesartan-hydrochlorothiazide (BENICAR HCT) 20-12.5 MG tablet  Daily        01/27/22 2057              Jacalyn LefevreHaviland, Sincerity Cedar, MD 01/27/22 2106

## 2022-05-28 ENCOUNTER — Emergency Department (HOSPITAL_BASED_OUTPATIENT_CLINIC_OR_DEPARTMENT_OTHER): Payer: Medicare HMO

## 2022-05-28 ENCOUNTER — Emergency Department (HOSPITAL_BASED_OUTPATIENT_CLINIC_OR_DEPARTMENT_OTHER)
Admission: EM | Admit: 2022-05-28 | Discharge: 2022-05-28 | Disposition: A | Discharge status: Home / Self Care | Payer: Medicare HMO | Attending: Emergency Medicine | Admitting: Emergency Medicine

## 2022-05-28 ENCOUNTER — Encounter (HOSPITAL_BASED_OUTPATIENT_CLINIC_OR_DEPARTMENT_OTHER): Payer: Self-pay | Admitting: Pediatrics

## 2022-05-28 ENCOUNTER — Other Ambulatory Visit: Payer: Self-pay

## 2022-05-28 DIAGNOSIS — Z20822 Contact with and (suspected) exposure to covid-19: Secondary | ICD-10-CM | POA: Insufficient documentation

## 2022-05-28 DIAGNOSIS — R2243 Localized swelling, mass and lump, lower limb, bilateral: Secondary | ICD-10-CM | POA: Insufficient documentation

## 2022-05-28 DIAGNOSIS — R059 Cough, unspecified: Secondary | ICD-10-CM | POA: Diagnosis present

## 2022-05-28 DIAGNOSIS — J40 Bronchitis, not specified as acute or chronic: Secondary | ICD-10-CM | POA: Insufficient documentation

## 2022-05-28 LAB — CBC WITH DIFFERENTIAL/PLATELET
Abs Immature Granulocytes: 0.07 10*3/uL (ref 0.00–0.07)
Basophils Absolute: 0.1 10*3/uL (ref 0.0–0.1)
Basophils Relative: 1 %
Eosinophils Absolute: 0.3 10*3/uL (ref 0.0–0.5)
Eosinophils Relative: 2 %
HCT: 35 % — ABNORMAL LOW (ref 36.0–46.0)
Hemoglobin: 11.6 g/dL — ABNORMAL LOW (ref 12.0–15.0)
Immature Granulocytes: 1 %
Lymphocytes Relative: 23 %
Lymphs Abs: 2.5 10*3/uL (ref 0.7–4.0)
MCH: 28 pg (ref 26.0–34.0)
MCHC: 33.1 g/dL (ref 30.0–36.0)
MCV: 84.5 fL (ref 80.0–100.0)
Monocytes Absolute: 0.5 10*3/uL (ref 0.1–1.0)
Monocytes Relative: 4 %
Neutro Abs: 7.5 10*3/uL (ref 1.7–7.7)
Neutrophils Relative %: 69 %
Platelets: 248 10*3/uL (ref 150–400)
RBC: 4.14 MIL/uL (ref 3.87–5.11)
RDW: 13.3 % (ref 11.5–15.5)
WBC: 10.8 10*3/uL — ABNORMAL HIGH (ref 4.0–10.5)
nRBC: 0 % (ref 0.0–0.2)

## 2022-05-28 LAB — COMPREHENSIVE METABOLIC PANEL
ALT: 16 U/L (ref 0–44)
AST: 18 U/L (ref 15–41)
Albumin: 3.2 g/dL — ABNORMAL LOW (ref 3.5–5.0)
Alkaline Phosphatase: 92 U/L (ref 38–126)
Anion gap: 6 (ref 5–15)
BUN: 24 mg/dL — ABNORMAL HIGH (ref 8–23)
CO2: 26 mmol/L (ref 22–32)
Calcium: 8.6 mg/dL — ABNORMAL LOW (ref 8.9–10.3)
Chloride: 107 mmol/L (ref 98–111)
Creatinine, Ser: 1.33 mg/dL — ABNORMAL HIGH (ref 0.44–1.00)
GFR, Estimated: 44 mL/min — ABNORMAL LOW (ref >60)
Glucose, Bld: 258 mg/dL — ABNORMAL HIGH (ref 70–99)
Potassium: 4.1 mmol/L (ref 3.5–5.1)
Sodium: 139 mmol/L (ref 135–145)
Total Bilirubin: 0.8 mg/dL (ref 0.3–1.2)
Total Protein: 7.2 g/dL (ref 6.5–8.1)

## 2022-05-28 LAB — SARS CORONAVIRUS 2 BY RT PCR: SARS Coronavirus 2 by RT PCR: NEGATIVE

## 2022-05-28 MED ORDER — FLUTICASONE PROPIONATE 50 MCG/ACT NA SUSP: 2.0000 | Freq: Every day | NASAL | 0 refills | Status: AC

## 2022-05-28 MED ORDER — IPRATROPIUM-ALBUTEROL 0.5-2.5 (3) MG/3ML IN SOLN
3.0000 mL | RESPIRATORY_TRACT | Status: AC
  Administered 2022-05-28: 3 mL via RESPIRATORY_TRACT
  Filled 2022-05-28: qty 3

## 2022-05-28 MED ORDER — CETIRIZINE HCL 10 MG PO TABS: 10.0000 mg | ORAL_TABLET | Freq: Every day | ORAL | 0 refills | Status: AC

## 2022-05-28 MED ORDER — PREDNISONE 10 MG PO TABS: 20.0000 mg | ORAL_TABLET | Freq: Every day | ORAL | 0 refills | Status: AC

## 2022-05-28 NOTE — Discharge Instructions (Addendum)
Take Zyrtec daily, sinus rinses with Nettie pot can be very helpful for improving the congestion.  Use your albuterol inhaler as needed for wheezing and shortness of breath.  Return to the emergency room for any new or concerning symptoms.  Continue take your blood pressure medications and talk to primary care doctor about your blood sugars/blood pressure. Your virus panel test will come back in a day or two

## 2022-05-28 NOTE — ED Triage Notes (Signed)
C/O cough, shortness of breath and some diarrhea when symptoms started on Thursday of last week. Reported was taking Delsyum and tylenol.

## 2022-05-28 NOTE — ED Provider Notes (Signed)
[facility] Provider Note   CSN: [id number] Arrival date & time: 05/28/22  1519     History  Chief Complaint  Patient presents with   Cough    [facility] is a 66 y.o. female.   Cough  Patient is a 66 year old female with a past medical history significant for occasional cough, some wheezing/shortness of breath, she states she had some loose stool over the past few days and states that she has a sore throat.  She states that sinus congestion has been her primary issue.  She has been trying Delsym and Tylenol at home.  She denies any chest pain lightheadedness or dizziness.  No fevers at home.  She was tested for COVID-19 at home with expired COVID-19 test.  She came to the ER for additional testing for COVID-19.  She is also quite concerned that she has RSV.   No recent surgeries, hospitalization, long travel, hemoptysis, estrogen containing OCP, cancer history.  No unilateral leg swelling.  No history of PE or VTE.     Home Medications Prior to Admission medications   Medication Sig Start Date End Date Taking? Authorizing Provider  cetirizine (ZYRTEC ALLERGY) 10 MG tablet Take 1 tablet (10 mg total) by mouth daily. 05/28/22  Yes Lalah Durango S, PA  fluticasone (FLONASE) 50 MCG/ACT nasal spray Place 2 sprays into both nostrils daily for 14 days. 05/28/22 06/11/22 Yes Montrae Braithwaite S, PA  predniSONE (DELTASONE) 10 MG tablet Take 2 tablets (20 mg total) by mouth daily for 5 days. 05/28/22 06/02/22 Yes Mujahid Jalomo S, PA  doxycycline (VIBRAMYCIN) 100 MG capsule Take 1 capsule (100 mg total) by mouth 2 (two) times daily. 01/27/22   Isla Pence, MD  empagliflozin (JARDIANCE) 10 MG TABS tablet Take 1 tablet (10 mg total) by mouth daily before breakfast. 01/27/22   Isla Pence, MD  meclizine (ANTIVERT) 25 MG tablet Take 1 tablet (25 mg total) by mouth 3 (three) times daily as needed for dizziness. 06/14/21   Blanchie Dessert, MD  metFORMIN  (GLUMETZA) 500 MG (MOD) 24 hr tablet Take 500 mg by mouth 2 (two) times daily with a meal.    [provider]  olmesartan-hydrochlorothiazide (BENICAR HCT) 20-12.5 MG tablet Take 1 tablet by mouth daily. 01/27/22   Isla Pence, MD      Allergies    Patient has no known allergies.    Review of Systems   Review of Systems  Respiratory:  Positive for cough.     Physical Exam Updated Vital Signs BP (!) 182/72   Pulse 76   Temp 98.1 F (36.7 C) (Oral)   Resp 16   Ht 6' 0.5" (1.842 m)   Wt 94.3 kg   LMP  (LMP Unknown)   SpO2 100%   BMI 27.82 kg/m  Physical Exam Vitals and nursing note reviewed.  Constitutional:      General: She is not in acute distress. HENT:     Head: Normocephalic and atraumatic.     Nose: Nose normal.  Eyes:     General: No scleral icterus. Cardiovascular:     Rate and Rhythm: Normal rate and regular rhythm.     Pulses: Normal pulses.     Heart sounds: Normal heart sounds.  Pulmonary:     Effort: Pulmonary effort is normal. No respiratory distress.     Breath sounds: Wheezing present.     Comments: Speaking in full sentences, occasionally coughing Abdominal:     Palpations: Abdomen is  soft.     Tenderness: There is no abdominal tenderness.  Musculoskeletal:     Cervical back: Normal range of motion.     Right lower leg: Edema present.     Left lower leg: Edema present.     Comments: Chronic BL LEE symmetric unchanged per pt.  Non-pitting.  No calf TTP  Skin:    General: Skin is warm and dry.     Capillary Refill: Capillary refill takes less than 2 seconds.  Neurological:     Mental Status: She is alert. Mental status is at baseline.  Psychiatric:        Mood and Affect: Mood normal.        Behavior: Behavior normal.    ED Results / Procedures / Treatments   Labs (all labs ordered are listed, but only abnormal results are displayed) Labs Reviewed  CBC WITH DIFFERENTIAL/PLATELET - Abnormal; Notable for the following  components:      Result Value   WBC 10.8 (*)    Hemoglobin 11.6 (*)    HCT 35.0 (*)    All other components within normal limits  COMPREHENSIVE METABOLIC PANEL - Abnormal; Notable for the following components:   Glucose, Bld 258 (*)    BUN 24 (*)    Creatinine, Ser 1.33 (*)    Calcium 8.6 (*)    Albumin 3.2 (*)    GFR, Estimated 44 (*)    All other components within normal limits  SARS CORONAVIRUS 2 BY RT PCR  RESPIRATORY PANEL BY PCR    EKG None  Radiology DG Chest 2 View  Result Date: 05/28/2022 CLINICAL DATA:  Cough with shortness of breath and diarrhea for 6 days. EXAM: CHEST - 2 VIEW COMPARISON:  Radiographs 01/27/2022. FINDINGS: The heart size is stable at the upper limits of normal. Interval improved aeration of the lung bases which are now clear. No pneumothorax or significant residual pleural effusion. The bones are unchanged with mild multilevel spondylosis. Cholecystectomy clips are noted. IMPRESSION: Resolved pleural effusions and bibasilar atelectasis. No acute cardiopulmonary process. Electronically Signed   By: Carey BullocksWilliam  Veazey M.D.   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 Viral symptoms.  [CC]  1740 Respiratory (~20 pathogens) panel by PCR [CC]    Clinical Course User Index [CC] Glyn Adeountryman, [place], MD                           Medical Decision Making Amount and/or Complexity of Data Reviewed Labs: ordered. Decision-making details documented in ED Course. Radiology: ordered.  Risk OTC drugs. Prescription drug management.   This patient presents to the ED for concern of cough, sinus congestion, loose stool, fatigue, SOB, this  involves a number of treatment options, and is a complaint that carries with it a moderate to high risk of complications and morbidity.  The differential diagnosis includes viral illness, pneumonia, pneumonitis, bronchitis.  Also considered and likely, high acuity differential such as pneumothorax, PE, ACS with atypical presentation.  Low suspicion for the symptoms given copious sinus congestion, sore throat and loose stool.  Suspect more likely this is a viral illness.  Co morbidities: Discussed in HPI   Brief History:  Patient is a 66 year old female with a past medical history significant for occasional cough, some wheezing/shortness of breath, she states she had some loose stool over the past few days and states that she has a sore throat.  She states that sinus congestion has been her primary issue.  She has been trying Delsym and Tylenol at home.  She denies any chest pain lightheadedness or dizziness.  No fevers at home.  She was tested for COVID-19 at home with expired COVID-19 test.  She came to the ER for additional testing for COVID-19.  She is also quite concerned that she has RSV.   No recent surgeries, hospitalization, long travel, hemoptysis, estrogen containing OCP, cancer history.  No unilateral leg swelling.  No history of PE or VTE.     EMR reviewed including pt PMHx, past surgical history and past visits to ER.   See HPI for more details   Lab Tests:   I ordered and independently interpreted labs. Labs notable for hyperglycemia which seems to be at patient's baseline.  Mild CKD at patient's baseline.  CBC with mild leukocytosis of 10.8, mild anemia not significantly changed from prior.  COVID PCR negative. Patient was swabbed with 20 pathogen PCR panel and treatment.  This will not result during ER visit.  Recommend that she follow-up on MyChart.  Imaging Studies:  NAD. I personally reviewed all imaging studies and no acute abnormality found. I agree with radiology  interpretation. No evidence of pneumonia or pneumothorax.  Compared to chest x-ray from May of this year pneumonia has resolved.   Cardiac Monitoring:  NA NA   Medicines ordered:  I ordered medication including DuoNeb for wheezing  Reevaluation of the patient after these medicines showed that the patient improved I have reviewed the patients home medicines and have made adjustments as needed   Critical Interventions:     Consults/Attending Physician  I discussed this case with my attending physician who cosigned this note including patient's presenting symptoms, physical exam, and planned diagnostics and interventions. Attending physician stated agreement with plan or made changes to plan which were implemented.   Attending physician assessed patient at bedside.    Reevaluation:  After the interventions noted above I re-evaluated patient and found that they have :improved   Social Determinants of Health:      Problem List / ED Course:  Cough, sinus congestion, loose stool.  Reassuring work-up here.  Chest x-ray without evidence of pneumonia.  COVID test negative.  Recommend follow-up with PCP.  Given DuoNeb with improvement in wheezing.  She has albuterol inhaler at home.  Prescribed low-dose prednisone 20 mg daily for 5 days given that she is somewhat hyperglycemic.  Per patient she states that she has had normal A1c's.  But on my review of EMR it seems that her blood sugars are consistently in the 200s.  We will follow-up with PCP regarding this.   Dispostion:  After consideration of the diagnostic results and the patients response to treatment, I feel that the patent would benefit from close follow-up with PCP.  Return precautions discussed  Final Clinical Impression(s) / ED Diagnoses Final diagnoses:  Bronchitis    Rx / DC Orders ED Discharge Orders          Ordered    predniSONE (DELTASONE) 10 MG tablet  Daily        05/28/22 1750    cetirizine  (ZYRTEC ALLERGY) 10 MG tablet  Daily        05/28/22 1750    fluticasone (FLONASE) 50 MCG/ACT nasal spray  Daily        05/28/22 1750              Gailen Shelter, [place] 05/28/22 2110    Glyn Ade, MD 05/28/22 2113

## 2022-05-29 LAB — RESPIRATORY PANEL BY PCR

## 2022-06-15 ENCOUNTER — Emergency Department (HOSPITAL_BASED_OUTPATIENT_CLINIC_OR_DEPARTMENT_OTHER): Payer: Medicare HMO

## 2022-06-15 ENCOUNTER — Emergency Department (HOSPITAL_BASED_OUTPATIENT_CLINIC_OR_DEPARTMENT_OTHER)
Admission: EM | Admit: 2022-06-15 | Discharge: 2022-06-15 | Discharge status: Against Medical Advice | Payer: Medicare HMO | Attending: Emergency Medicine | Admitting: Emergency Medicine

## 2022-06-15 ENCOUNTER — Encounter (HOSPITAL_BASED_OUTPATIENT_CLINIC_OR_DEPARTMENT_OTHER): Payer: Self-pay | Admitting: Emergency Medicine

## 2022-06-15 ENCOUNTER — Other Ambulatory Visit: Payer: Self-pay

## 2022-06-15 DIAGNOSIS — I10 Essential (primary) hypertension: Secondary | ICD-10-CM | POA: Insufficient documentation

## 2022-06-15 DIAGNOSIS — Z5329 Procedure and treatment not carried out because of patient's decision for other reasons: Secondary | ICD-10-CM | POA: Diagnosis not present

## 2022-06-15 DIAGNOSIS — Z7984 Long term (current) use of oral hypoglycemic drugs: Secondary | ICD-10-CM | POA: Diagnosis not present

## 2022-06-15 DIAGNOSIS — H5789 Other specified disorders of eye and adnexa: Secondary | ICD-10-CM | POA: Diagnosis present

## 2022-06-15 DIAGNOSIS — R9082 White matter disease, unspecified: Secondary | ICD-10-CM | POA: Insufficient documentation

## 2022-06-15 DIAGNOSIS — E119 Type 2 diabetes mellitus without complications: Secondary | ICD-10-CM | POA: Diagnosis not present

## 2022-06-15 DIAGNOSIS — Z79899 Other long term (current) drug therapy: Secondary | ICD-10-CM | POA: Diagnosis not present

## 2022-06-15 DIAGNOSIS — H5461 Unqualified visual loss, right eye, normal vision left eye: Secondary | ICD-10-CM | POA: Insufficient documentation

## 2022-06-15 MED ORDER — HYDRALAZINE HCL 25 MG PO TABS
25.0000 mg | ORAL_TABLET | Freq: Once | ORAL | Status: AC
  Administered 2022-06-15: 25 mg via ORAL
  Filled 2022-06-15: qty 1

## 2022-06-15 MED ORDER — IRBESARTAN 150 MG PO TABS
150.0000 mg | ORAL_TABLET | Freq: Every day | ORAL | Status: DC
  Administered 2022-06-15: 150 mg via ORAL
  Filled 2022-06-15: qty 1

## 2022-06-15 MED ORDER — HYDRALAZINE HCL 20 MG/ML IJ SOLN
5.0000 mg | Freq: Once | INTRAMUSCULAR | Status: AC
  Administered 2022-06-15: 5 mg via INTRAMUSCULAR

## 2022-06-15 MED ORDER — HYDRALAZINE HCL 20 MG/ML IJ SOLN
5.0000 mg | Freq: Once | INTRAMUSCULAR | Status: DC
  Filled 2022-06-15: qty 1

## 2022-06-15 MED ORDER — TETRACAINE HCL 0.5 % OP SOLN
1.0000 [drp] | Freq: Once | OPHTHALMIC | Status: AC
  Administered 2022-06-15: 1 [drp] via OPHTHALMIC
  Filled 2022-06-15: qty 4

## 2022-06-15 MED ORDER — FLUORESCEIN SODIUM 1 MG OP STRP
1.0000 | ORAL_STRIP | Freq: Once | OPHTHALMIC | Status: AC
  Administered 2022-06-15: 1 via OPHTHALMIC
  Filled 2022-06-15: qty 1

## 2022-06-15 NOTE — ED Notes (Signed)
Provider is aware of the patients blood pressure

## 2022-06-15 NOTE — ED Provider Notes (Signed)
[facility]ERGENC[facility] DEPARTMENT Provider Note   CSN: [id number] Arrival date & time: 06/15/22  1036     History  Chief Complaint  Patient presents with   Eye Problem    [facility] is a 66 y.o. female.  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  These are described as wavy lines in her vision.  She still had preserved visual acuity.  Upon waking up on Saturday morning, floaters were worse and her visual acuity was decreased.  This has evolved to "cloudy" vision, and a reddish tint to her vision.  No eye pain, headache. Patient denies signs of stroke including: facial droop, slurred speech, aphasia, weakness/numbness in extremities, imbalance/trouble walking.  No neck pain or injury.  She denies previous eye surgeries or corrective lenses.  Patient reports taking her blood pressure medication and diabetes medications last night.       Home Medications Prior to Admission medications   Medication Sig Start Date End Date Taking? Authorizing Provider  cetirizine (ZYRTEC ALLERGY) 10 MG tablet Take 1 tablet (10 mg total) by mouth daily. 05/28/22   Tedd Sias, PA  doxycycline (VIBRAMYCIN) 100 MG capsule Take 1 capsule (100 mg total) by mouth 2 (two) times daily. 01/27/22   Isla Pence, MD  empagliflozin (JARDIANCE) 10 MG TABS tablet Take 1 tablet (10 mg total) by mouth daily before breakfast. 01/27/22   Isla Pence, MD  fluticasone (FLONASE) 50 MCG/ACT nasal spray Place 2 sprays into both nostrils daily for 14 days. 05/28/22 06/11/22  Tedd Sias, PA  meclizine (ANTIVERT) 25 MG tablet Take 1 tablet (25 mg total) by mouth 3 (three) times daily as needed for dizziness. 06/14/21   Blanchie Dessert,  MD  metFORMIN (GLUMETZA) 500 MG (MOD) 24 hr tablet Take 500 mg by mouth 2 (two) times daily with a meal.    [provider]  olmesartan-hydrochlorothiazide (BENICAR HCT) 20-12.5 MG tablet Take 1 tablet by mouth daily. 01/27/22   Isla Pence, MD      Allergies    Patient has no known allergies.    Review of Systems   Review of Systems  Physical Exam Updated Vital Signs BP (!) 220/102   Pulse 62   Temp (!) 97.5 F (36.4 C) (Oral)   Resp 16   Ht 6' 0.5" (1.842 m)   Wt 95.3 kg   LMP  (LMP Unknown)   SpO2 100%   BMI 28.09 kg/m   Physical Exam Vitals and nursing note reviewed.  Constitutional:      General: She is not in acute distress.    Appearance: She is well-developed.  HENT:     Head: Normocephalic and atraumatic.     Right Ear: External ear normal.     Left Ear: External ear normal.     Nose: Nose normal.     Mouth/Throat:     Mouth: Mucous membranes are moist.  Eyes:     General: Lids are normal.     Extraocular Movements:     Right eye: Normal extraocular motion.     Left eye: Normal extraocular motion.     Conjunctiva/sclera:     Right eye: Right conjunctiva is not injected. No chemosis or exudate.  Left eye: Left conjunctiva is not injected. No chemosis or exudate.    Pupils: Pupils are equal, round, and reactive to light.     Right eye: Pupil is round, reactive and not sluggish.     Left eye: Pupil is round, reactive and not sluggish.     Slit lamp exam:    Right eye: Anterior chamber quiet.     Left eye: Anterior chamber quiet.     Comments: On nondilated exam, I can see blood vessels distinctly on the retina on the left, however I cannot make out any details on the right.  Cardiovascular:     Rate and Rhythm: Normal rate and regular rhythm.     Heart sounds: No murmur heard. Pulmonary:     Effort: No respiratory distress.     Breath sounds: No wheezing, rhonchi or rales.  Abdominal:     Palpations: Abdomen is soft.     Tenderness:  There is no abdominal tenderness. There is no guarding or rebound.  Musculoskeletal:     Cervical back: Normal range of motion and neck supple.     Right lower leg: No edema.     Left lower leg: No edema.  Skin:    General: Skin is warm and dry.     Findings: No rash.  Neurological:     General: No focal deficit present.     Mental Status: She is alert. Mental status is at baseline.     Motor: No weakness.  Psychiatric:        Mood and Affect: Mood normal.    ED Results / Procedures / Treatments   Labs (all labs ordered are listed, but only abnormal results are displayed) Labs Reviewed - No data to display  EKG None  Radiology CT Head Wo Contrast  Result Date: 06/15/2022 CLINICAL DATA:  Painless loss of vision in the right eye. Patient first soft floaters in the right eye on Friday which progressed to cloudy vision yesterday. EXAM: CT HEAD WITHOUT CONTRAST TECHNIQUE: Contiguous axial images were obtained from the base of the skull through the vertex without intravenous contrast. RADIATION DOSE REDUCTION: This exam was performed according to the departmental dose-optimization program which includes automated exposure control, adjustment of the mA and/or kV according to patient size and/or use of iterative reconstruction technique. COMPARISON:  CT head without contrast 06/14/21 FINDINGS: Brain: Mild atrophy and moderate diffuse white matter changes are stable. No acute infarct, hemorrhage, or mass lesion is present. The ventricles are of normal size. No significant extraaxial fluid collection is present. The brainstem and cerebellum are within normal limits. Vascular: Atherosclerotic calcifications are present within the cavernous internal carotid arteries bilaterally, similar the prior exam. No hyperdense vessel is present. Skull: Calvarium is intact. No focal lytic or blastic lesions are present. No significant extracranial soft tissue lesion is present. Sinuses/Orbits: The paranasal  sinuses and mastoid air cells are clear. The globes and orbits are within normal limits. IMPRESSION: 1. No acute intracranial abnormality or significant interval change. 2. Stable atrophy and moderate diffuse white matter disease. This likely reflects the sequela of chronic microvascular ischemia. Electronically Signed   By: Marin Roberts M.D.   On: 06/15/2022 12:16    Procedures Procedures    Medications Ordered in ED Medications  irbesartan (AVAPRO) tablet 150 mg (150 mg Oral Given 06/15/22 1245)  hydrALAZINE (APRESOLINE) tablet 25 mg (has no administration in time range)  fluorescein ophthalmic strip 1 strip (1 strip Right Eye Given 06/15/22 1247)  tetracaine (  PONTOCAINE) 0.5 % ophthalmic solution 1 drop (1 drop Left Eye Given 06/15/22 1247)    ED Course/ Medical Decision Making/ A&P    Patient seen and examined. History obtained directly from patient.   Labs/EKG: None ordered  Imaging: Ordered CT head noncontrast.  Medications/Fluids: Tetracaine  Most recent vital signs reviewed and are as follows: BP (!) 226/93   Pulse 62   Temp (!) 97.5 F (36.4 C) (Oral)   Resp 16   Ht 6' 0.5" (1.842 m)   Wt 95.3 kg   LMP  (LMP Unknown)   SpO2 100%   BMI 28.09 kg/m   Initial impression: Right eye vision change, hypertension  EMERGENCY DEPARTMENT [place] OCULAR EXAM "Study: Limited Ultrasound of Orbit "  INDICATIONS: Vision loss Linear probe utilized to obtain images in both long and short axis of the orbit having the patient look left and right if possible.  PERFORMED BY: Myself IMAGES ARCHIVED?: Yes LIMITATIONS: none VIEWS USED: Right orbit INTERPRETATION: Possible retinal detachment versus vitreous hemorrhage  I discussed case and reviewed images with Dr. Eloise Harman.    I have spoken with Dr. Genia Del of ophthalmology regarding patient's exam and findings.  He recommends the patient come directly to office at 9 AM tomorrow morning for evaluation.  Requests patient do not eat  or drink after midnight.  Updated patient on this conversation, and she is in agreement.  We will continue to work on blood pressure.  Patient ordered a dose of her home blood pressure medications.  4:33 PM Reassessment performed several times in interim. Patient appears stable during each assessment, comfortable.  She has been treated with oral irbesartan, oral hydralazine, IM hydralazine.  This has not improved her blood pressure.  On last reassessment, patient is sitting comfortably on the side of the bed.  She states that she is anxious to leave because she is hungry.  We had a long discussion, with family at bedside, regarding her markedly elevated blood pressure.  I told her I was worried about this especially with her vision change.  She attempted to reassure me telling me that "it will go down" after she leaves.  She is very willing to follow-up with her primary care doctor as an outpatient and with ophthalmology tomorrow.  She then tells me about how her blood pressure has been more poorly controlled after they stopped HCTZ about a month ago due to dizziness.  I discussed that elevated blood pressure can lead to stroke, vision problems, heart failure, vascular injury, and kidney problems.  We discussed continued treatment in the emergency department and admission.  She states that she understands the risks and would like to go home regardless.  She reiterates that she is going to call her doctor tomorrow to get this addressed.  Most current vital signs reviewed and are as follows: BP (!) 240/84   Pulse (!) 39   Temp 97.6 F (36.4 C) (Oral)   Resp 18   Ht 6' 0.5" (1.842 m)   Wt 95.3 kg   LMP  (LMP Unknown)   SpO2 91%   BMI 28.09 kg/m   Plan: Discharge to home, will leave AGAINST MEDICAL ADVICE.  She appears to have capacity and understands possible consequences of her decisions.  Other home care instructions discussed: Patient instructed to take her blood pressure medication as  prescribed.  ED return instructions discussed: Return if she changes her mind about treatment or further evaluation.  Return with worsening visual change, vision loss, strokelike symptoms, chest  pain, shortness of breath.  Follow-up instructions discussed: Patient encouraged to follow-up with their PCP tomorrow.  She is aware of need to go to ophthalmology office at 9 AM tomorrow morning to be seen.                          Medical Decision Making Amount and/or Complexity of Data Reviewed Radiology: ordered.  Risk Prescription drug management.   Right eye vision change: Work-up today concerning for retinal detachment versus vitreous hemorrhage.  Patient will be seen by ophthalmology first thing in the morning.  Low concern for acute stroke.  No bleeding noted on CT of the head.  Hypertension: Patient with markedly elevated blood pressure during ED stay with treatment as above.  She is on monotherapy with ARB.  About a month ago she was taken off of HCTZ.  Patient without other indications of endorgan damage today.  Encourage patient to remain in ED for continued treatment of her blood pressure, however she decided to leave AGAINST MEDICAL ADVICE.  She does seem reliable to follow-up with her PCP for this.  She does seem reliable to return if something does change or get worse.         Final Clinical Impression(s) / ED Diagnoses Final diagnoses:  Vision loss of right eye  Hypertension, unspecified type    Rx / DC Orders ED Discharge Orders     None         Renne Crigler, PA-C 06/15/22 1921    Rondel Baton, MD 06/16/22 [phone]

## 2022-06-15 NOTE — ED Notes (Signed)
Patient blood pressure is elevated denies any s/s of hypertension . States that she has not taken her blood pressure medication this am.Denies any headaches ,chest pain, numbness or tingling in her extremties

## 2022-06-15 NOTE — ED Notes (Signed)
States cannot see out of rt eye see floater denies any headaches or pain

## 2022-06-15 NOTE — Discharge Instructions (Signed)
As we discussed, you will need to be evaluated by the ophthalmologist at 9 AM tomorrow.  Please go to Dr. Colan Neptune office directly at that time.  Do not eat or drink after midnight.  Your blood pressure is also dangerously high today.  Please continue taking your home blood pressure medications.  You should call your primary care doctor tomorrow for close follow-up.

## 2022-06-15 NOTE — ED Triage Notes (Signed)
Pt reports vision change in RT eye since Friday; saw floaters on Fri and then it worsened; starting Saturday, vision became cloudy and saw red lines and spirals; denies pain

## 2022-06-15 NOTE — ED Notes (Signed)
Pt is requesting to leave against EDP advice until b/p is lowered. Pt has agreed to sign AMA document

## 2022-06-15 NOTE — ED Notes (Addendum)
Rn explained the risk of leaving with high blood pressure. Patient verbalized understanding an states that she will contact her primary doctor tomorrow. Patient verbalize understanding of what meant to leave ama Patient denies any headaches or chest pain

## 2023-06-03 ENCOUNTER — Emergency Department (HOSPITAL_BASED_OUTPATIENT_CLINIC_OR_DEPARTMENT_OTHER): Payer: Medicare HMO

## 2023-06-03 ENCOUNTER — Encounter (HOSPITAL_BASED_OUTPATIENT_CLINIC_OR_DEPARTMENT_OTHER): Payer: Self-pay

## 2023-06-03 ENCOUNTER — Other Ambulatory Visit: Payer: Self-pay

## 2023-06-03 ENCOUNTER — Inpatient Hospital Stay (HOSPITAL_BASED_OUTPATIENT_CLINIC_OR_DEPARTMENT_OTHER)
Admission: EM | Admit: 2023-06-03 | Discharge: 2023-06-06 | DRG: 304 | Disposition: A | Discharge status: Home / Self Care | Payer: Medicare HMO | Attending: Internal Medicine | Admitting: Internal Medicine

## 2023-06-03 DIAGNOSIS — Z7984 Long term (current) use of oral hypoglycemic drugs: Secondary | ICD-10-CM

## 2023-06-03 DIAGNOSIS — I5033 Acute on chronic diastolic (congestive) heart failure: Secondary | ICD-10-CM | POA: Diagnosis not present

## 2023-06-03 DIAGNOSIS — R42 Dizziness and giddiness: Secondary | ICD-10-CM | POA: Diagnosis not present

## 2023-06-03 DIAGNOSIS — E785 Hyperlipidemia, unspecified: Secondary | ICD-10-CM | POA: Diagnosis present

## 2023-06-03 DIAGNOSIS — I161 Hypertensive emergency: Secondary | ICD-10-CM | POA: Diagnosis not present

## 2023-06-03 DIAGNOSIS — N1832 Chronic kidney disease, stage 3b: Secondary | ICD-10-CM

## 2023-06-03 DIAGNOSIS — E1122 Type 2 diabetes mellitus with diabetic chronic kidney disease: Secondary | ICD-10-CM | POA: Diagnosis present

## 2023-06-03 DIAGNOSIS — Z79899 Other long term (current) drug therapy: Secondary | ICD-10-CM

## 2023-06-03 DIAGNOSIS — H548 Legal blindness, as defined in USA: Secondary | ICD-10-CM | POA: Diagnosis present

## 2023-06-03 DIAGNOSIS — E1140 Type 2 diabetes mellitus with diabetic neuropathy, unspecified: Secondary | ICD-10-CM | POA: Diagnosis present

## 2023-06-03 DIAGNOSIS — I1A0 Resistant hypertension: Secondary | ICD-10-CM | POA: Diagnosis present

## 2023-06-03 DIAGNOSIS — I130 Hypertensive heart and chronic kidney disease with heart failure and stage 1 through stage 4 chronic kidney disease, or unspecified chronic kidney disease: Secondary | ICD-10-CM | POA: Diagnosis present

## 2023-06-03 DIAGNOSIS — N179 Acute kidney failure, unspecified: Secondary | ICD-10-CM | POA: Diagnosis not present

## 2023-06-03 DIAGNOSIS — N184 Chronic kidney disease, stage 4 (severe): Secondary | ICD-10-CM | POA: Diagnosis present

## 2023-06-03 DIAGNOSIS — Z1152 Encounter for screening for COVID-19: Secondary | ICD-10-CM

## 2023-06-03 DIAGNOSIS — Z888 Allergy status to other drugs, medicaments and biological substances status: Secondary | ICD-10-CM

## 2023-06-03 DIAGNOSIS — E119 Type 2 diabetes mellitus without complications: Secondary | ICD-10-CM

## 2023-06-03 DIAGNOSIS — N183 Chronic kidney disease, stage 3 unspecified: Secondary | ICD-10-CM | POA: Diagnosis present

## 2023-06-03 DIAGNOSIS — E1165 Type 2 diabetes mellitus with hyperglycemia: Secondary | ICD-10-CM | POA: Diagnosis present

## 2023-06-03 DIAGNOSIS — I509 Heart failure, unspecified: Secondary | ICD-10-CM

## 2023-06-03 LAB — COMPREHENSIVE METABOLIC PANEL
ALT: 13 U/L (ref 0–44)
AST: 15 U/L (ref 15–41)
Albumin: 3.5 g/dL (ref 3.5–5.0)
Alkaline Phosphatase: 108 U/L (ref 38–126)
Anion gap: 6 (ref 5–15)
BUN: 39 mg/dL — ABNORMAL HIGH (ref 8–23)
CO2: 24 mmol/L (ref 22–32)
Calcium: 8.7 mg/dL — ABNORMAL LOW (ref 8.9–10.3)
Chloride: 108 mmol/L (ref 98–111)
Creatinine, Ser: 2 mg/dL — ABNORMAL HIGH (ref 0.44–1.00)
GFR, Estimated: 27 mL/min — ABNORMAL LOW (ref >60)
Glucose, Bld: 238 mg/dL — ABNORMAL HIGH (ref 70–99)
Potassium: 4.6 mmol/L (ref 3.5–5.1)
Sodium: 138 mmol/L (ref 135–145)
Total Bilirubin: 0.5 mg/dL (ref 0.3–1.2)
Total Protein: 7.1 g/dL (ref 6.5–8.1)

## 2023-06-03 LAB — RESP PANEL BY RT-PCR (RSV, FLU A&B, COVID)  RVPGX2
Influenza A by PCR: NEGATIVE
Influenza B by PCR: NEGATIVE
Resp Syncytial Virus by PCR: NEGATIVE
SARS Coronavirus 2 by RT PCR: NEGATIVE

## 2023-06-03 LAB — CBC WITH DIFFERENTIAL/PLATELET
Abs Immature Granulocytes: 0.03 10*3/uL (ref 0.00–0.07)
Basophils Absolute: 0.1 10*3/uL (ref 0.0–0.1)
Basophils Relative: 1 %
Eosinophils Absolute: 0.2 10*3/uL (ref 0.0–0.5)
Eosinophils Relative: 2 %
HCT: 33.7 % — ABNORMAL LOW (ref 36.0–46.0)
Hemoglobin: 11.3 g/dL — ABNORMAL LOW (ref 12.0–15.0)
Immature Granulocytes: 0 %
Lymphocytes Relative: 23 %
Lymphs Abs: 2.1 10*3/uL (ref 0.7–4.0)
MCH: 27.5 pg (ref 26.0–34.0)
MCHC: 33.5 g/dL (ref 30.0–36.0)
MCV: 82 fL (ref 80.0–100.0)
Monocytes Absolute: 0.4 10*3/uL (ref 0.1–1.0)
Monocytes Relative: 4 %
Neutro Abs: 6.6 10*3/uL (ref 1.7–7.7)
Neutrophils Relative %: 70 %
Platelets: 237 10*3/uL (ref 150–400)
RBC: 4.11 MIL/uL (ref 3.87–5.11)
RDW: 13.1 % (ref 11.5–15.5)
WBC: 9.3 10*3/uL (ref 4.0–10.5)
nRBC: 0 % (ref 0.0–0.2)

## 2023-06-03 LAB — URINALYSIS, MICROSCOPIC (REFLEX)

## 2023-06-03 LAB — URINALYSIS, ROUTINE W REFLEX MICROSCOPIC
Bilirubin Urine: NEGATIVE
Glucose, UA: NEGATIVE mg/dL
Ketones, ur: NEGATIVE mg/dL
Nitrite: NEGATIVE
Protein, ur: >=300 mg/dL — AB
Specific Gravity, Urine: 1.01 (ref 1.005–1.030)
pH: 5.5 (ref 5.0–8.0)

## 2023-06-03 LAB — GLUCOSE, CAPILLARY: Glucose-Capillary: 248 mg/dL — ABNORMAL HIGH (ref 70–99)

## 2023-06-03 LAB — TROPONIN I (HIGH SENSITIVITY)
Troponin I (High Sensitivity): 14 ng/L (ref <18)
Troponin I (High Sensitivity): 17 ng/L (ref <18)

## 2023-06-03 LAB — BRAIN NATRIURETIC PEPTIDE: B Natriuretic Peptide: 421.9 pg/mL — ABNORMAL HIGH (ref 0.0–100.0)

## 2023-06-03 LAB — CBG MONITORING, ED: Glucose-Capillary: 213 mg/dL — ABNORMAL HIGH (ref 70–99)

## 2023-06-03 MED ORDER — FUROSEMIDE 10 MG/ML IJ SOLN: 20.0000 mg | Freq: Two times a day (BID) | INTRAMUSCULAR | Status: DC

## 2023-06-03 MED ORDER — INSULIN ASPART 100 UNIT/ML IJ SOLN: 0.0000 [IU] | Freq: Three times a day (TID) | INTRAMUSCULAR | Status: DC

## 2023-06-03 MED ORDER — LABETALOL HCL 5 MG/ML IV SOLN
10.0000 mg | Freq: Once | INTRAVENOUS | Status: AC
  Administered 2023-06-03: 10 mg via INTRAVENOUS
  Filled 2023-06-03: qty 4

## 2023-06-03 MED ORDER — AMLODIPINE BESYLATE 5 MG PO TABS
10.0000 mg | ORAL_TABLET | Freq: Once | ORAL | Status: AC
  Administered 2023-06-03: 10 mg via ORAL
  Filled 2023-06-03: qty 2

## 2023-06-03 MED ORDER — SODIUM CHLORIDE 0.9% FLUSH
3.0000 mL | Freq: Two times a day (BID) | INTRAVENOUS | Status: DC
  Administered 2023-06-03 – 2023-06-05 (×5): 3 mL via INTRAVENOUS

## 2023-06-03 MED ORDER — HYDRALAZINE HCL 20 MG/ML IJ SOLN
5.0000 mg | Freq: Once | INTRAMUSCULAR | Status: AC
  Administered 2023-06-03: 5 mg via INTRAVENOUS

## 2023-06-03 MED ORDER — FUROSEMIDE 10 MG/ML IJ SOLN
20.0000 mg | Freq: Two times a day (BID) | INTRAMUSCULAR | Status: DC
  Administered 2023-06-03 – 2023-06-04 (×3): 20 mg via INTRAVENOUS
  Filled 2023-06-03 (×3): qty 2

## 2023-06-03 MED ORDER — LABETALOL HCL 5 MG/ML IV SOLN: 20.0000 mg | Freq: Once | INTRAVENOUS | Status: DC

## 2023-06-03 MED ORDER — ACETAMINOPHEN 325 MG PO TABS
650.0000 mg | ORAL_TABLET | Freq: Four times a day (QID) | ORAL | Status: DC | PRN
  Administered 2023-06-04 – 2023-06-05 (×2): 650 mg via ORAL
  Filled 2023-06-03 (×2): qty 2

## 2023-06-03 MED ORDER — INSULIN ASPART 100 UNIT/ML IJ SOLN
0.0000 [IU] | Freq: Every day | INTRAMUSCULAR | Status: DC
  Administered 2023-06-03 – 2023-06-05 (×2): 2 [IU] via SUBCUTANEOUS

## 2023-06-03 MED ORDER — HEPARIN SODIUM (PORCINE) 5000 UNIT/ML IJ SOLN
5000.0000 [IU] | Freq: Three times a day (TID) | INTRAMUSCULAR | Status: DC
  Administered 2023-06-03 – 2023-06-06 (×8): 5000 [IU] via SUBCUTANEOUS
  Filled 2023-06-03 (×8): qty 1

## 2023-06-03 MED ORDER — CHLORHEXIDINE GLUCONATE CLOTH 2 % EX PADS
6.0000 | MEDICATED_PAD | Freq: Every day | CUTANEOUS | Status: DC
  Administered 2023-06-03: 6 via TOPICAL
  Filled 2023-06-03: qty 6

## 2023-06-03 MED ORDER — AMLODIPINE BESYLATE 10 MG PO TABS: 10.0000 mg | ORAL_TABLET | Freq: Every day | ORAL | Status: DC

## 2023-06-03 MED ORDER — NICARDIPINE HCL IN NACL 20-0.86 MG/200ML-% IV SOLN
3.0000 mg/h | INTRAVENOUS | Status: DC
  Administered 2023-06-03: 5 mg/h via INTRAVENOUS
  Administered 2023-06-04: 15 mg/h via INTRAVENOUS
  Filled 2023-06-03 (×4): qty 200

## 2023-06-03 MED ORDER — NICARDIPINE HCL 2.5 MG/ML IV SOLN: 0.5000 ug/kg/min | INTRAVENOUS | Status: DC

## 2023-06-03 MED ORDER — LABETALOL HCL 200 MG PO TABS: 200.0000 mg | ORAL_TABLET | Freq: Three times a day (TID) | ORAL | Status: DC

## 2023-06-03 MED ORDER — HYDRALAZINE HCL 20 MG/ML IJ SOLN
5.0000 mg | Freq: Once | INTRAMUSCULAR | Status: AC
  Administered 2023-06-03: 5 mg via INTRAVENOUS
  Filled 2023-06-03: qty 1

## 2023-06-03 MED ORDER — HYDRALAZINE HCL 20 MG/ML IJ SOLN
10.0000 mg | Freq: Four times a day (QID) | INTRAMUSCULAR | Status: DC | PRN
  Filled 2023-06-03: qty 1

## 2023-06-03 MED ORDER — MECLIZINE HCL 25 MG PO TABS
25.0000 mg | ORAL_TABLET | Freq: Three times a day (TID) | ORAL | Status: DC | PRN
  Administered 2023-06-04: 25 mg via ORAL
  Filled 2023-06-03 (×2): qty 1

## 2023-06-03 MED ORDER — ACETAMINOPHEN 650 MG RE SUPP: 650.0000 mg | Freq: Four times a day (QID) | RECTAL | Status: DC | PRN

## 2023-06-03 MED ORDER — SENNOSIDES-DOCUSATE SODIUM 8.6-50 MG PO TABS: 1.0000 | ORAL_TABLET | Freq: Every evening | ORAL | Status: DC | PRN

## 2023-06-03 NOTE — ED Triage Notes (Addendum)
Pt states she has been dizzy x 2 days Also fatigued Pt has 2 - 3+ pitting edema in her feet

## 2023-06-03 NOTE — ED Notes (Signed)
ED TO INPATIENT HANDOFF REPORT  ED Nurse Name and Phone #: Linnell Swords  S Name/Age/Gender Kaitlin Boyer 67 y.o. female Room/Bed: MH12/MH12  Code Status   Code Status: Not on file  Home/SNF/Other Home Patient oriented to: self, place, time, and situation Is this baseline? Yes   Triage Complete: Triage complete  Chief Complaint Hypertensive emergency [I16.1]  Triage Note Pt states she has been dizzy x 2 days Also fatigued Pt has 2 - 3+ pitting edema in her feet   Allergies No Known Allergies  Level of Care/Admitting Diagnosis ED Disposition     ED Disposition  Admit   Condition  --   Comment  Hospital Area: [facility] [[id number]]  Level of Care: Stepdown [14]  Admit to SDU based on following criteria: Cardiac Instability:  Patients experiencing chest pain, unconfirmed MI and stable, arrhythmias and CHF requiring medical management and potentially compromising patient's stability  Interfacility transfer: Yes  May place patient in observation at [facility] or [facility] if equivalent level of care is available:: Yes  Covid Evaluation: Confirmed COVID Negative  Diagnosis: Hypertensive emergency [371062]  Admitting Physician: DEFAULT, PROVIDER [[id number]]  Attending Physician: Loetta Rough [[id number]]          B Medical/Surgery History Past Medical History:  Diagnosis Date   Diabetes mellitus without complication (HCC)    Hypertension    Past Surgical History:  Procedure Laterality Date   ABDOMINAL HYSTERECTOMY     CHOLECYSTECTOMY       A IV Location/Drains/Wounds Patient Lines/Drains/Airways Status     Active Line/Drains/Airways     Name Placement date Placement time Site Days   Peripheral IV 06/03/23 20 G Right Antecubital 06/03/23  1613  Antecubital  less than 1            Intake/Output Last 24 hours No intake or output data in the 24 hours ending 06/03/23 1915  Labs/Imaging Results for orders placed or performed during the  hospital encounter of 06/03/23 (from the past 48 hour(s))  CBG monitoring, ED     Status: Abnormal   Collection Time: 06/03/23  4:04 PM  Result Value Ref Range   Glucose-Capillary 213 (H) 70 - 99 mg/dL    Comment: Glucose reference range applies only to samples taken after fasting for at least 8 hours.   Comment 1 Notify RN   Comprehensive metabolic panel     Status: Abnormal   Collection Time: 06/03/23  4:10 PM  Result Value Ref Range   Sodium 138 135 - 145 mmol/L   Potassium 4.6 3.5 - 5.1 mmol/L   Chloride 108 98 - 111 mmol/L   CO2 24 22 - 32 mmol/L   Glucose, Bld 238 (H) 70 - 99 mg/dL    Comment: Glucose reference range applies only to samples taken after fasting for at least 8 hours.   BUN 39 (H) 8 - 23 mg/dL   Creatinine, Ser 2.70 (H) 0.44 - 1.00 mg/dL   Calcium 8.7 (L) 8.9 - 10.3 mg/dL   Total Protein 7.1 6.5 - 8.1 g/dL   Albumin 3.5 3.5 - 5.0 g/dL   AST 15 15 - 41 U/L   ALT 13 0 - 44 U/L   Alkaline Phosphatase 108 38 - 126 U/L   Total Bilirubin 0.5 0.3 - 1.2 mg/dL   GFR, Estimated 27 (L) >60 mL/min    Comment: (NOTE) Calculated using the CKD-EPI Creatinine Equation (2021)    Anion gap 6 5 - 15  Comment: Performed at [facility], [address] [place], [place] [address]  CBC with Differential     Status: Abnormal   Collection Time: 06/03/23  4:10 PM  Result Value Ref Range   WBC 9.3 4.0 - 10.5 K/uL   RBC 4.11 3.87 - 5.11 MIL/uL   Hemoglobin 11.3 (L) 12.0 - 15.0 g/dL   HCT 51.7 (L) 61.6 - 07.3 %   MCV 82.0 80.0 - 100.0 fL   MCH 27.5 26.0 - 34.0 pg   MCHC 33.5 30.0 - 36.0 g/dL   RDW 71.0 62.6 - 94.8 %   Platelets 237 150 - 400 K/uL   nRBC 0.0 0.0 - 0.2 %   Neutrophils Relative % 70 %   Neutro Abs 6.6 1.7 - 7.7 K/uL   Lymphocytes Relative 23 %   Lymphs Abs 2.1 0.7 - 4.0 K/uL   Monocytes Relative 4 %   Monocytes Absolute 0.4 0.1 - 1.0 K/uL   Eosinophils Relative 2 %   Eosinophils Absolute 0.2 0.0 - 0.5 K/uL   Basophils Relative 1 %   Basophils  Absolute 0.1 0.0 - 0.1 K/uL   Immature Granulocytes 0 %   Abs Immature Granulocytes 0.03 0.00 - 0.07 K/uL    Comment: Performed at [facility], [address] [place], [place] [address]  Brain natriuretic peptide     Status: Abnormal   Collection Time: 06/03/23  4:10 PM  Result Value Ref Range   B Natriuretic Peptide 421.9 (H) 0.0 - 100.0 pg/mL    Comment: Performed at [facility], [address] [place], [place] [address]  Urinalysis, Routine w reflex microscopic -Urine, Clean Catch     Status: Abnormal   Collection Time: 06/03/23  4:10 PM  Result Value Ref Range   Color, Urine YELLOW YELLOW   APPearance CLEAR CLEAR   Specific Gravity, Urine 1.010 1.005 - 1.030   pH 5.5 5.0 - 8.0   Glucose, UA NEGATIVE NEGATIVE mg/dL   Hgb urine dipstick TRACE (A) NEGATIVE   Bilirubin Urine NEGATIVE NEGATIVE   Ketones, ur NEGATIVE NEGATIVE mg/dL   Protein, ur >=938 (A) NEGATIVE mg/dL   Nitrite NEGATIVE NEGATIVE   Leukocytes,Ua SMALL (A) NEGATIVE    Comment: Performed at [facility], [address] [place], [place] [address]  Urinalysis, Microscopic (reflex)     Status: Abnormal   Collection Time: 06/03/23  4:10 PM  Result Value Ref Range   RBC / HPF 0-5 0 - 5 RBC/hpf   WBC, UA 6-10 0 - 5 WBC/hpf   Bacteria, UA RARE (A) NONE SEEN   Squamous Epithelial / HPF 0-5 0 - 5 /HPF    Comment: Performed at [facility], [address] [place], [place] [address]  Troponin I (High Sensitivity)     Status: None   Collection Time: 06/03/23  4:10 PM  Result Value Ref Range   Troponin I (High Sensitivity) 14 <18 ng/L    Comment: (NOTE) Elevated high sensitivity troponin I (hsTnI) values and significant  changes across serial measurements may suggest ACS but many other  chronic and acute conditions are known to elevate hsTnI results.  Refer to the "Links" section for chest pain algorithms and additional  guidance. Performed at [facility], [address] [place], [place] [address]   Resp panel by RT-PCR (RSV, Flu A&B, Covid) Anterior Nasal Swab     Status: None  Collection Time: 06/03/23  4:11 PM   Specimen: Anterior Nasal Swab  Result Value Ref Range   SARS Coronavirus 2 by RT PCR NEGATIVE NEGATIVE    Comment: (NOTE) SARS-CoV-2 target nucleic acids are NOT DETECTED.  The SARS-CoV-2 RNA is generally detectable in upper respiratory specimens during the acute phase of infection. The lowest concentration of SARS-CoV-2 viral copies this assay can detect is 138 copies/mL. A negative result does not preclude SARS-Cov-2 infection and should not be used as the sole basis for treatment or other patient management decisions. A negative result may occur with  improper specimen collection/handling, submission of specimen other than nasopharyngeal swab, presence of viral mutation(s) within the areas targeted by this assay, and inadequate number of viral copies(<138 copies/mL). A negative result must be combined with clinical observations, patient history, and epidemiological information. The expected result is Negative.  Fact Sheet for Patients:  BloggerCourse.com  Fact Sheet for Healthcare Providers:  SeriousBroker.it  This test is no t yet approved or cleared by the [place] FDA and  has been authorized for detection and/or diagnosis of SARS-CoV-2 by FDA under an Emergency Use Authorization (EUA). This EUA will remain  in effect (meaning this test can be used) for the duration of the COVID-19 declaration under Section 564(b)(1) of the Act, 21 U.S.C.section 360bbb-3(b)(1), unless the authorization is terminated  or revoked sooner.       Influenza A by PCR NEGATIVE NEGATIVE   Influenza B by PCR NEGATIVE NEGATIVE    Comment: (NOTE) The Xpert Xpress SARS-CoV-2/FLU/RSV plus assay is intended as an aid in the diagnosis of influenza from Nasopharyngeal swab specimens  and should not be used as a sole basis for treatment. Nasal washings and aspirates are unacceptable for Xpert Xpress SARS-CoV-2/FLU/RSV testing.  Fact Sheet for Patients: BloggerCourse.com  Fact Sheet for Healthcare Providers: SeriousBroker.it  This test is not yet approved or cleared by the [place] FDA and has been authorized for detection and/or diagnosis of SARS-CoV-2 by FDA under an Emergency Use Authorization (EUA). This EUA will remain in effect (meaning this test can be used) for the duration of the COVID-19 declaration under Section 564(b)(1) of the Act, 21 U.S.C. section 360bbb-3(b)(1), unless the authorization is terminated or revoked.     Resp Syncytial Virus by PCR NEGATIVE NEGATIVE    Comment: (NOTE) Fact Sheet for Patients: BloggerCourse.com  Fact Sheet for Healthcare Providers: SeriousBroker.it  This test is not yet approved or cleared by the [place] FDA and has been authorized for detection and/or diagnosis of SARS-CoV-2 by FDA under an Emergency Use Authorization (EUA). This EUA will remain in effect (meaning this test can be used) for the duration of the COVID-19 declaration under Section 564(b)(1) of the Act, 21 U.S.C. section 360bbb-3(b)(1), unless the authorization is terminated or revoked.  Performed at [facility], [address] [place], [place] [address]    CT Head Wo Contrast  Result Date: 06/03/2023 CLINICAL DATA:  Severe dizziness hypertension EXAM: CT HEAD WITHOUT CONTRAST TECHNIQUE: Contiguous axial images were obtained from the base of the skull through the vertex without intravenous contrast. RADIATION DOSE REDUCTION: This exam was performed according to the departmental dose-optimization program which includes automated exposure control, adjustment of the mA and/or kV according to patient size and/or use of iterative  reconstruction technique. COMPARISON:  CT brain 06/15/2022 FINDINGS: Brain: No acute territorial infarction, hemorrhage or intracranial mass. Mild atrophy. Mild chronic small vessel ischemic changes of the white matter. The ventricles are  nonenlarged. Vascular: No hyperdense vessels.  Carotid vascular calcification Skull: Normal. Negative for fracture or focal lesion. Sinuses/Orbits: No acute finding. Other: None IMPRESSION: 1. No CT evidence for acute intracranial abnormality. 2. Mild atrophy and chronic small vessel ischemic changes of the white matter. Electronically Signed   By: Sahira Pang M.D.   On: 06/03/2023 18:02    Pending Labs Unresulted Labs (From admission, onward)    None       Vitals/Pain Today's Vitals   06/03/23 1636 06/03/23 1744 06/03/23 1813 06/03/23 1915  BP: (!) 208/76 (!) 213/80 (!) 225/86 (!) 207/70  Pulse: 71 63 62 68  Resp: 14 14 10  (!) 28  Temp:      SpO2: 100% 100% 100% 100%  Weight:      Height:      PainSc:        Isolation Precautions No active isolations  Medications Medications  hydrALAZINE (APRESOLINE) injection 5 mg (5 mg Intravenous Given 06/03/23 1706)  hydrALAZINE (APRESOLINE) injection 5 mg (5 mg Intravenous Given 06/03/23 1812)  labetalol (NORMODYNE) injection 10 mg (10 mg Intravenous Given 06/03/23 1850)  amLODipine (NORVASC) tablet 10 mg (10 mg Oral Given 06/03/23 1850)    Mobility walks        R Recommendations: See Admitting Provider Note  Report given to:   Additional Notes:

## 2023-06-03 NOTE — Progress Notes (Signed)
Patient has arrived from [facility]. Admitting number paged and informed that Dr. Janalyn Shy will be admitting.

## 2023-06-03 NOTE — Hospital Course (Addendum)
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 In the ED alert awake oriented x 3, hypertensive with BP 208-242/72-108, heart rate 60s to 80s, not hypoxic. Labs with worsening of creatinine at 2, elevated BNP 421 troponin 14.  UA with proteinuria WBC 6-10.  CT head no acute finding but mild atrophy and chronic small vessel ischemic changes. Patient was given hydralazine 5 mg x 2. Admission requested for further management. In care-everywhere-she saw cardio Dr Flonnie Hailstone at [facility]  5/13 for  leg edema dyspnea w/ dizziness>meds placed on amlodipine 10, hydraalzine 50 tid, labetalol 200 tid, lasix 40 prn . But she is only taking omlesartan? TTE 02/06/23: LVEF 55-60% Moderate diastolic dysfunction [pseudonormal pattern] with elevated left atrial pressure.  CXR with no active cardiopulmonary disease.  Patient was started on Cardene infusion and IV Lasix.  9/26: Blood pressure remained elevated at 198/81.  Labs with blood glucose of 287, CO2 18, mild pseudohyponatremia secondary to hyperglycemia, some improvement of creatinine to 1.8, Mild leukocytosis at 11, A1c 7.8, lipid panel with elevated total cholesterol, triglyceride and LDL of 104.  Adding Semglee 10 units twice daily and making SSI to moderate. Patient remained on Cardene infusion, renal arterial duplex was ordered. Echocardiogram ordered-pending  9/27: Blood pressure improving, able to wean off from Cardene infusion.  Echocardiogram with normal EF, moderate concentric LVH and mild mitral stenosis.  Renal arterial duplex with 1 to 59% of bilateral stenosis which is likely not the cause of her resistant hypertension. Slight worsening of creatinine-nephrology was consulted.  Adding amlodipine to her regimen.  9/28: Blood pressure  improving, although still mildly elevated.  Nephrology also saw her and renal function currently seems stable.  Either this is a mild AKI with underlying CKD stage IIIb or she has progressed to CKD stage IV and nephrology will determine that as outpatient. Her Jardiance and olmesartan has been discontinued.  Patient is being discharged on glipizide for her diabetes, she might need insulin if need to add another agent and her PCP should be able to provide if needed.  She is being discharged on amlodipine 10 mg daily, labetalol 200 mg 3 times a day and hydralazine 50 mg 3 times a day for now.  Nephrology will have a close follow-up and manage her antihypertensives as outpatient.  Patient will continue on current medications and need to have a close follow-up with her providers for further recommendations.

## 2023-06-03 NOTE — H&P (Signed)
History and Physical    Kaitlin Boyer LKG:[id number] DOB: February 20, 1956 DOA: 06/03/2023  PCP: Melvenia Beam, MD   Patient coming from: Home   Chief Complaint:  Chief Complaint  Patient presents with   Dizziness    HPI:  Kaitlin Boyer is a 67 y.o. female with medical history significant of essential hypertension, diastolic heart failure, CKD, chronic vertigo and non-insulin DM type II presented to emergency department with complaining of dizziness for last few weeks that has been worsened over the last 2 days.  Patient also complaining about worsening bilateral lower extremities swelling.  She reported that recently she has been taking only Omlesatren-hydrochlorothiazide for blood pressure management.  Patient is also endorsing headache.  Denies any chest pain, shortness of breath, palpitation, weakness, numbness, focal weakness, speech difficulty fall, trauma, nausea, vomiting and diarrhea.  Patient reported she is checking her blood pressure regular basis at home it runs up to 170-180.  Per chart review patient follows outpatient cardiology with [facility] with Dr. Lesia Hausen last clinic visit on 01/19/2023.  Patient has been managing for hypertension with labetalol 200 mg 3 times daily, amlodipine 10 mg daily, hydralazine 50 mg 3 times daily and Lasix 40 mg as needed for lower extremity swelling.  At presentation to ED blood pressure found elevated to 202/108, heart rate 81, respiratory rate 20 and O2 sat 100% room air. CMP unremarkable except elevated blood glucose 234, elevated BUN 39, elevated creatinine 2 (baseline creatinine around 1.3 and baseline GFR in between 42-44). CBC grossly unremarkable with a stable H&H 11.3 and 33.7. Elevated BNP 420. Troponin x 2 are 14 and 17 within normal range. EKG showed normal sinus rhythm, heart rate 76, evidence of right atrial enlargement.  There is no ST-T wave abnormality.  While patient in the ER at [facility] she  has been treated with labetalol 10 mg once and amlodipine 10 mg oral once.  Blood pressure is still in the upper 200 range.  Chest x-ray no acute abnormality/no active cardiopulmonary disease.  As patient is complaining about dizziness CT head obtained.  There is no acute evidence of endocrine abnormality.  Mild atrophy and chronic small vessel ischemic change of the heart matter.  Patient has been transferred to [facility] for further evaluation management of hypertensive emergency.    Review of Systems:  Review of Systems  Constitutional:  Negative for chills, fever, malaise/fatigue and weight loss.  Eyes:  Negative for blurred vision.  Respiratory:  Negative for cough, sputum production and shortness of breath.   Cardiovascular:  Positive for leg swelling. Negative for chest pain, palpitations and orthopnea.  Gastrointestinal:  Negative for abdominal pain, heartburn, nausea and vomiting.  Musculoskeletal:  Negative for myalgias and neck pain.  Neurological:  Positive for headaches. Negative for dizziness, tremors, focal weakness and weakness.  Psychiatric/Behavioral:  The patient is not nervous/anxious.     Past Medical History:  Diagnosis Date   Diabetes mellitus without complication (HCC)    Hypertension     Past Surgical History:  Procedure Laterality Date   ABDOMINAL HYSTERECTOMY     CHOLECYSTECTOMY       reports that she has never smoked. She has never used smokeless tobacco. She reports current alcohol use. She reports that she does not use drugs.  No Known Allergies  History reviewed. No pertinent family history.  Prior to Admission medications   Medication Sig Start Date End Date Taking? Authorizing Provider  cetirizine (ZYRTEC ALLERGY) 10 MG  tablet Take 1 tablet (10 mg total) by mouth daily. 05/28/22   Gailen Shelter, PA  doxycycline (VIBRAMYCIN) 100 MG capsule Take 1 capsule (100 mg total) by mouth 2 (two) times daily. 01/27/22   Jacalyn Lefevre, MD   empagliflozin (JARDIANCE) 10 MG TABS tablet Take 1 tablet (10 mg total) by mouth daily before breakfast. 01/27/22   Jacalyn Lefevre, MD  fluticasone (FLONASE) 50 MCG/ACT nasal spray Place 2 sprays into both nostrils daily for 14 days. 05/28/22 06/11/22  Gailen Shelter, PA  meclizine (ANTIVERT) 25 MG tablet Take 1 tablet (25 mg total) by mouth 3 (three) times daily as needed for dizziness. 06/14/21   Gwyneth Sprout, MD  metFORMIN (GLUMETZA) 500 MG (MOD) 24 hr tablet Take 500 mg by mouth 2 (two) times daily with a meal.    [provider]  olmesartan-hydrochlorothiazide (BENICAR HCT) 20-12.5 MG tablet Take 1 tablet by mouth daily. 01/27/22   Jacalyn Lefevre, MD     Physical Exam: Vitals:   06/03/23 1950 06/03/23 2000 06/03/23 2010 06/03/23 2030  BP: (!) 216/78 (!) 224/74 (!) 218/72 (!) 210/87  Pulse: 61 63 64 71  Resp: 15 (!) 21 17 (!) 24  Temp:   97.9 F (36.6 C)   TempSrc:   Oral   SpO2: 100% 100% 100% 100%  Weight:      Height:        Physical Exam Constitutional:      General: She is not in acute distress.    Appearance: She is not ill-appearing.  HENT:     Mouth/Throat:     Mouth: Mucous membranes are moist.  Cardiovascular:     Rate and Rhythm: Normal rate and regular rhythm.     Pulses: Normal pulses.     Heart sounds: Normal heart sounds.  Pulmonary:     Effort: Pulmonary effort is normal.     Breath sounds: Normal breath sounds.  Abdominal:     General: Bowel sounds are normal.  Musculoskeletal:     Cervical back: Neck supple.     Right lower leg: Edema present.     Left lower leg: Edema present.  Skin:    General: Skin is warm.     Capillary Refill: Capillary refill takes less than 2 seconds.  Neurological:     Mental Status: She is alert and oriented to person, place, and time.      Labs on Admission: I have personally reviewed following labs and imaging studies  CBC: Recent Labs  Lab 06/03/23 1610  WBC 9.3  NEUTROABS 6.6  HGB 11.3*  HCT  33.7*  MCV 82.0  PLT 237   Basic Metabolic Panel: Recent Labs  Lab 06/03/23 1610  NA 138  K 4.6  CL 108  CO2 24  GLUCOSE 238*  BUN 39*  CREATININE 2.00*  CALCIUM 8.7*   GFR: Estimated Creatinine Clearance: 35.7 mL/min (A) (by C-G formula based on SCr of 2 mg/dL (H)). Liver Function Tests: Recent Labs  Lab 06/03/23 1610  AST 15  ALT 13  ALKPHOS 108  BILITOT 0.5  PROT 7.1  ALBUMIN 3.5   No results for input(s): "LIPASE", "AMYLASE" in the last 168 hours. No results for input(s): "AMMONIA" in the last 168 hours. Coagulation Profile: No results for input(s): "INR", "PROTIME" in the last 168 hours. Cardiac Enzymes: Recent Labs  Lab 06/03/23 1610 06/03/23 1845  TROPONINIHS 14 17   BNP (last 3 results) Recent Labs    06/03/23 1610  BNP 421.9*  HbA1C: No results for input(s): "HGBA1C" in the last 72 hours. CBG: Recent Labs  Lab 06/03/23 1604 06/03/23 2322  GLUCAP 213* 248*   Lipid Profile: No results for input(s): "CHOL", "HDL", "LDLCALC", "TRIG", "CHOLHDL", "LDLDIRECT" in the last 72 hours. Thyroid Function Tests: No results for input(s): "TSH", "T4TOTAL", "FREET4", "T3FREE", "THYROIDAB" in the last 72 hours. Anemia Panel: No results for input(s): "VITAMINB12", "FOLATE", "FERRITIN", "TIBC", "IRON", "RETICCTPCT" in the last 72 hours. Urine analysis:    Component Value Date/Time   COLORURINE YELLOW 06/03/2023 1610   APPEARANCEUR CLEAR 06/03/2023 1610   LABSPEC 1.010 06/03/2023 1610   PHURINE 5.5 06/03/2023 1610   GLUCOSEU NEGATIVE 06/03/2023 1610   HGBUR TRACE (A) 06/03/2023 1610   HGBUR negative 06/25/2007 0000   BILIRUBINUR NEGATIVE 06/03/2023 1610   KETONESUR NEGATIVE 06/03/2023 1610   PROTEINUR >=300 (A) 06/03/2023 1610   UROBILINOGEN 0.2 06/25/2007 0000   NITRITE NEGATIVE 06/03/2023 1610   LEUKOCYTESUR SMALL (A) 06/03/2023 1610    Radiological Exams on Admission: I have personally reviewed images DG Chest Portable 1 View  Result Date:  06/03/2023 CLINICAL DATA:  Dizziness and hypertension. EXAM: PORTABLE CHEST 1 VIEW COMPARISON:  May 28, 2022 FINDINGS: The heart size and mediastinal contours are within normal limits. Both lungs are clear. Multilevel degenerative changes are seen throughout the thoracic spine. IMPRESSION: No active cardiopulmonary disease. Electronically Signed   By: Aram Candela M.D.   On: 06/03/2023 19:53   CT Head Wo Contrast  Result Date: 06/03/2023 CLINICAL DATA:  Severe dizziness hypertension EXAM: CT HEAD WITHOUT CONTRAST TECHNIQUE: Contiguous axial images were obtained from the base of the skull through the vertex without intravenous contrast. RADIATION DOSE REDUCTION: This exam was performed according to the departmental dose-optimization program which includes automated exposure control, adjustment of the mA and/or kV according to patient size and/or use of iterative reconstruction technique. COMPARISON:  CT brain 06/15/2022 FINDINGS: Brain: No acute territorial infarction, hemorrhage or intracranial mass. Mild atrophy. Mild chronic small vessel ischemic changes of the white matter. The ventricles are nonenlarged. Vascular: No hyperdense vessels.  Carotid vascular calcification Skull: Normal. Negative for fracture or focal lesion. Sinuses/Orbits: No acute finding. Other: None IMPRESSION: 1. No CT evidence for acute intracranial abnormality. 2. Mild atrophy and chronic small vessel ischemic changes of the white matter. Electronically Signed   By: Allyssia Pang M.D.   On: 06/03/2023 18:02    EKG: My personal interpretation of EKG shows:EKG showed normal sinus rhythm, heart rate 76, evidence of right atrial enlargement.  There is no ST-T wave abnormality.    Assessment/Plan: Principal Problem:   Hypertensive emergency Active Problems:   Acute kidney injury (HCC)   CKD (chronic kidney disease) stage 3, GFR 30-59 ml/min (HCC)   Dizziness   Non-insulin treated type 2 diabetes mellitus (HCC)   CHF  exacerbation (HCC)    Assessment and Plan: Hypertensive emergency CHF exacerbation History of diastolic heart failure -Patient presenting with headache and concern for elevated blood pressure in the ER.  In the ER initial blood pressure 242/108 which continues to remain high in upper 200 range.  Patient received hide lysine 5 mg x 2, labetalol 10 mg IV x 2 doses and amlodipine 10 mg; even with that patient systolic blood pressure remained to 255 and diastolic blood pressure 95 evaluation at the bedside.  Patient heart rate remaining in between 64-71.  -CMP showed elevated creatinine 2.  Fortunately patient's troponin within normal range and EKG unremarkable. -Chest x-ray no acute cardiopulmonary disease.  No evidence of pulmonary edema. -Meets criteria of hypertensive emergency in the setting of elevated creatinine. -Physical exam showed bilateral lower extremities pitting edema 1+. -Has history of diastolic heart failure however unable to find any echocardiogram on the chart.  Also concern for CHF exacerbation as well. -Per chart review cardiology note patient supposed to  on labetalol 200 mg 3 times daily, amlodipine 10 mg daily, hydralazine 50 mg 3 times daily and Lasix 40 mg as needed for lower extremity swelling; however patient reported she is taking Benicar once daily.  Unclear which timeline Benicar has been started on other blood pressure regimen has been taken off. -Given patient systolic blood pressure in upper 240s range and heart rate in between 65-71 unable to start labetalol drip at this moment.  Patient received multiple doses of labetalol and hydralazine in the ER without much improvement. - Starting Cardene drip tonight and holding any oral blood pressure regimen. - Goal MAP reduction 10 to 20% in first hour and additional 5 to 15% over the next 23 hours. - Target blood pressure <180/120 mmHg for the past hour and <160/<110 mmHg over the next 23 hours. -Also starting Lasix 20 mg  IV twice daily.  Assess volume status on daily basis and adjust dose of diuretics.  Continue strict I's/O and daily weight.  Continue to monitor urine output. - Checking echocardiogram. -Once blood pressure will be improved and renal function will comes to baseline consider resume Benicar. -Continue cardiac monitoring - Continue monitor development of any chest pain and shortness of breath.  Chronic vertigo Patient presenting with dizziness.  However per per chart review she has history of chronic dizziness and take meclizine as needed.  Patient complaining about headache in the setting of elevated blood pressure.  Denies any blurry vision, focal weakness, speech difficulty and generalized weakness. - CT head unremarkable unremarkable for any acute finding. - Continue meclizine 25 mg 3 times daily as needed - Continue fall precaution.  AKI on CKD 3B -Lab check showed creatinine 2.  Patient's baseline creatinine 1.1-1.3 and GFR in between 42-44 - Acute kidney injury in the setting of hypertensive emergency.  Hoping that with improvement of blood pressure renal function will improve as well. -Check urine creatinine and sodium to calculate FENa - Continue to check renal function, urine output. - Holding Benicar in the setting of acute kidney injury. - Avoid nephrotoxic agent.  Non-insulin-dependent DM type II -At home patient is on Jardiance and metformin. - Holding Jardiance metformin in setting of acute kidney injury - Checking A1c and lipid panel - Continue POC blood glucose check, low-dose sliding scale insulin as needed and at bedtime insulin as needed.   DVT prophylaxis:  SQ Heparin Code Status:  Full Code.  Verified with patient Diet: Heart healthy carb modified diet Family Communication: No family member at bedside Disposition Plan: Pending improvement of blood pressure. Consults: none at this moment. Admission status:   Inpatient, Step Down Unit  Severity of Illness: The  appropriate patient status for this patient is INPATIENT. Inpatient status is judged to be reasonable and necessary in order to provide the required intensity of service to ensure the patient's safety. The patient's presenting symptoms, physical exam findings, and initial radiographic and laboratory data in the context of their chronic comorbidities is felt to place them at high risk for further clinical deterioration. Furthermore, it is not anticipated that the patient will be medically stable for discharge from the hospital within 2 midnights of admission.   *  I certify that at the point of admission it is my clinical judgment that the patient will require inpatient hospital care spanning beyond 2 midnights from the point of admission due to high intensity of service, high risk for further deterioration and high frequency of surveillance required.Marland Kitchen    Tereasa Coop, MD [facility]  How to contact the [facility] Attending or Consulting provider 7A - 7P or covering provider during after hours 7P -7A, for this patient.  Check the care team in [facility] and look for a) attending/consulting [facility] provider listed and b) the [facility] team listed Log into [email or web address] and use [place]'s universal password to access. If you do not have the password, please contact the hospital operator. Locate the [facility] provider you are looking for under [facility] and page to a number that you can be directly reached. If you still have difficulty reaching the provider, please page the [facility] (Director on Call) for the Hospitalists listed on amion for assistance.  06/03/2023, 11:38 PM

## 2023-06-03 NOTE — ED Provider Notes (Signed)
[place] [facility]PARTMENT AT [facility] Provider Note   CSN: [id number] Arrival date & time: 06/03/23  1549     History  Chief Complaint  Patient presents with   Dizziness    Kaitlin Boyer is a 67 y.o. female with PMH as listed below who presents with dizzy for a few weeks, worsening over the last 2 days. No Cp/SOB. Endorses bilateral feet swelling that she associates with high blood pressure. Only taking olmesartan-hydrochlorothiazide for BP now. Also endorses fatigue and mild headache. No numbness tingling, asymmetric weakness, facial droop, slurred speech, falls, trauma, hematuria, nausea vomiting diarrhea constipation, fever/chills.  States she does not check her blood pressure very regularly but it is normally in the 170s.  She reports that in April she had her blood pressure medicines decreased because she was getting dizzy.   Past Medical History:  Diagnosis Date   Diabetes mellitus without complication (HCC)    Hypertension        Home Medications Prior to Admission medications   Medication Sig Start Date End Date Taking? Authorizing Provider  cetirizine (ZYRTEC ALLERGY) 10 MG tablet Take 1 tablet (10 mg total) by mouth daily. 05/28/22   Gailen Shelter, PA  doxycycline (VIBRAMYCIN) 100 MG capsule Take 1 capsule (100 mg total) by mouth 2 (two) times daily. 01/27/22   Jacalyn Lefevre, MD  empagliflozin (JARDIANCE) 10 MG TABS tablet Take 1 tablet (10 mg total) by mouth daily before breakfast. 01/27/22   Jacalyn Lefevre, MD  fluticasone (FLONASE) 50 MCG/ACT nasal spray Place 2 sprays into both nostrils daily for 14 days. 05/28/22 06/11/22  Gailen Shelter, PA  meclizine (ANTIVERT) 25 MG tablet Take 1 tablet (25 mg total) by mouth 3 (three) times daily as needed for dizziness. 06/14/21   Gwyneth Sprout, MD  metFORMIN (GLUMETZA) 500 MG (MOD) 24 hr tablet Take 500 mg by mouth 2 (two) times daily with a meal.    [provider]   olmesartan-hydrochlorothiazide (BENICAR HCT) 20-12.5 MG tablet Take 1 tablet by mouth daily. 01/27/22   Jacalyn Lefevre, MD      Allergies    Patient has no known allergies.    Review of Systems   Review of Systems A 10 point review of systems was performed and is negative unless otherwise reported in HPI.  Physical Exam Updated Vital Signs BP (!) 225/86   Pulse 62   Temp 98 F (36.7 C)   Resp 10   Ht 6' (1.829 m)   Wt 100.7 kg   LMP  (LMP Unknown)   SpO2 100%   BMI 30.11 kg/m  Physical Exam General: Normal appearing female, lying in bed.  HEENT: PERRLA, Sclera anicteric, MMM, trachea midline.  Cardiology: RRR, no murmurs/rubs/gallops. BL radial and DP pulses equal bilaterally.  Resp: Normal respiratory rate and effort. CTAB, no wheezes, rhonchi, crackles.  Abd: Soft, non-tender, non-distended. No rebound tenderness or guarding.  GU: Deferred. MSK: No peripheral edema or signs of trauma. Extremities without deformity or TTP. No cyanosis or clubbing. Skin: warm, dry. No rashes or lesions. Back: No CVA tenderness Neuro: A&Ox4, CNs II-XII grossly intact. MAEs. Sensation grossly intact.  Psych: Normal mood and affect.   ED Results / Procedures / Treatments   Labs (all labs ordered are listed, but only abnormal results are displayed) Labs Reviewed  COMPREHENSIVE METABOLIC PANEL - Abnormal; Notable for the following components:      Result Value   Glucose, Bld 238 (*)    BUN 39 (*)  Creatinine, Ser 2.00 (*)    Calcium 8.7 (*)    GFR, Estimated 27 (*)    All other components within normal limits  CBC WITH DIFFERENTIAL/PLATELET - Abnormal; Notable for the following components:   Hemoglobin 11.3 (*)    HCT 33.7 (*)    All other components within normal limits  BRAIN NATRIURETIC PEPTIDE - Abnormal; Notable for the following components:   B Natriuretic Peptide 421.9 (*)    All other components within normal limits  URINALYSIS, ROUTINE W REFLEX MICROSCOPIC - Abnormal;  Notable for the following components:   Hgb urine dipstick TRACE (*)    Protein, ur >=300 (*)    Leukocytes,Ua SMALL (*)    All other components within normal limits  URINALYSIS, MICROSCOPIC (REFLEX) - Abnormal; Notable for the following components:   Bacteria, UA RARE (*)    All other components within normal limits  CBG MONITORING, ED - Abnormal; Notable for the following components:   Glucose-Capillary 213 (*)    All other components within normal limits  RESP PANEL BY RT-PCR (RSV, FLU A&B, COVID)  RVPGX2  TROPONIN I (HIGH SENSITIVITY)  TROPONIN I (HIGH SENSITIVITY)    EKG EKG Interpretation Date/Time:  Wednesday June 03 2023 16:08:25 EDT Ventricular Rate:  76 PR Interval:  148 QRS Duration:  99 QT Interval:  392 QTC Calculation: 441 R Axis:   60  Text Interpretation: Sinus rhythm Consider right atrial enlargement Borderline repolarization abnormality Similar to prior EKG Confirmed by Vivi Barrack [phone]) on 06/03/2023 4:24:41 PM  Radiology CT Head Wo Contrast  Result Date: 06/03/2023 CLINICAL DATA:  Severe dizziness hypertension EXAM: CT HEAD WITHOUT CONTRAST TECHNIQUE: Contiguous axial images were obtained from the base of the skull through the vertex without intravenous contrast. RADIATION DOSE REDUCTION: This exam was performed according to the departmental dose-optimization program which includes automated exposure control, adjustment of the mA and/or kV according to patient size and/or use of iterative reconstruction technique. COMPARISON:  CT brain 06/15/2022 FINDINGS: Brain: No acute territorial infarction, hemorrhage or intracranial mass. Mild atrophy. Mild chronic small vessel ischemic changes of the white matter. The ventricles are nonenlarged. Vascular: No hyperdense vessels.  Carotid vascular calcification Skull: Normal. Negative for fracture or focal lesion. Sinuses/Orbits: No acute finding. Other: None IMPRESSION: 1. No CT evidence for acute intracranial  abnormality. 2. Mild atrophy and chronic small vessel ischemic changes of the white matter. Electronically Signed   By: Nour Pang M.D.   On: 06/03/2023 18:02    Procedures .Critical Care  Performed by: Loetta Rough, MD Authorized by: Loetta Rough, MD   Critical care provider statement:    Critical care time (minutes):  30   Critical care was necessary to treat or prevent imminent or life-threatening deterioration of the following conditions: hypertensive emergency.   Critical care was time spent personally by me on the following activities:  Development of treatment plan with patient or surrogate, evaluation of patient's response to treatment, examination of patient, ordering and review of laboratory studies, ordering and review of radiographic studies, ordering and performing treatments and interventions, pulse oximetry, re-evaluation of patient's condition, review of old charts and obtaining history from patient or surrogate   Care discussed with: accepting provider at another facility       Medications Ordered in ED Medications  labetalol (NORMODYNE) injection 10 mg (has no administration in time range)  amLODipine (NORVASC) tablet 10 mg (has no administration in time range)  hydrALAZINE (APRESOLINE) injection 5 mg (5 mg Intravenous  Given 06/03/23 1706)  hydrALAZINE (APRESOLINE) injection 5 mg (5 mg Intravenous Given 06/03/23 1812)    ED Course/ Medical Decision Making/ A&P                          Medical Decision Making Amount and/or Complexity of Data Reviewed Labs: ordered. Decision-making details documented in ED Course. Radiology: ordered. Decision-making details documented in ED Course.  Risk Prescription drug management.    This patient presents to the ED for concern of dizziness, found to be hypertensive to 242/108, this involves an extensive number of treatment options, and is a complaint that carries with it a high risk of complications and morbidity.  I  considered the following differential and admission for this acute, potentially life threatening condition.   MDM:    Patient is overall well appearing at this time. Blood pressure is 242/108 mmHg.   Patient has symptoms on history including dizziness/headache which could be concerning, but lower c/f: -ACS given no CP, no SOB, normal cardio-pulmonary exam -SAH/stroke given no hx of acute stroke like sxs and normal neurologic exam.  Patient does have a headache and dizziness and for therefore we will obtain a CT head to rule out ICH or other acute intracranial abnormality.  Labs demonstrate AKI with initial troponin negative.Marland Kitchen EKG without signs of ischemia. Patient is given hydralazine and then labetalol IV for her blood pressure.  Also appears that she is to be taking amlodipine so talked with hospitalist who suggested amlodipine 10 mg p.o.  Due to AKI and hypertension consider hypertensive emergency and patient will be admitted to stepdown unit [facility].   Clinical Course as of 06/03/23 1842  Wed Jun 03, 2023  1703 Comprehensive metabolic panel(!) +AKI [HN]  1703 Urinalysis, Routine w reflex microscopic -Urine, Clean Catch(!) Increasing proteinuria w/ trace Hgb [HN]  1703 CBC with Differential(!) No leukocytosis, Hgb stable [HN]  1704 Resp panel by RT-PCR (RSV, Flu A&B, Covid) Anterior Nasal Swab Neg [HN]  1733 Troponin I (High Sensitivity): 14 Neg [HN]  1809 CT Head Wo Contrast Negative CT head.  Pending hospitalist admission for hypertensive emergency. [HN]  1821 BP(!): 225/86 Giving additional hydralazine [HN]  1837 Will try labetalol IV as well. Patient will be admitted to stepdown unit at [facility] [HN]    Clinical Course User Index [HN] Loetta Rough, MD    Labs: I Ordered, and personally interpreted labs.  The pertinent results include: Those listed above  Imaging Studies ordered: I ordered imaging studies including chest x-ray per hospital request  Additional  history obtained from chart review.   Cardiac Monitoring: The patient was maintained on a cardiac monitor.  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, works as Lawyer  Disposition:  Admit to hospitalist  Co morbidities that complicate the patient evaluation  Past Medical History:  Diagnosis Date   Diabetes mellitus without complication (HCC)    Hypertension      Medicines Meds ordered this encounter  Medications   hydrALAZINE (APRESOLINE) injection 5 mg   hydrALAZINE (APRESOLINE) injection 5 mg   labetalol (NORMODYNE) injection 10 mg   amLODipine (NORVASC) tablet 10 mg    I have reviewed the patients home medicines and have made adjustments as needed  Problem List /  ED Course: Problem List Items Addressed This Visit       Cardiovascular and Mediastinum   * (Principal) Hypertensive emergency - Primary   Relevant Medications   labetalol (NORMODYNE) injection 10 mg (Start on 06/03/2023  6:45 PM)   amLODipine (NORVASC) tablet 10 mg (Start on 06/03/2023  6:45 PM)   Other Visit Diagnoses     Dizziness       AKI (acute kidney injury) (HCC)                       This note was created using dictation software, which may contain spelling or grammatical errors.    Loetta Rough, MD 06/03/23 [id number]

## 2023-06-04 ENCOUNTER — Observation Stay (HOSPITAL_COMMUNITY): Payer: Medicare HMO

## 2023-06-04 ENCOUNTER — Encounter (HOSPITAL_COMMUNITY): Payer: Medicare HMO

## 2023-06-04 DIAGNOSIS — E1140 Type 2 diabetes mellitus with diabetic neuropathy, unspecified: Secondary | ICD-10-CM | POA: Diagnosis present

## 2023-06-04 DIAGNOSIS — Z7984 Long term (current) use of oral hypoglycemic drugs: Secondary | ICD-10-CM | POA: Diagnosis not present

## 2023-06-04 DIAGNOSIS — E1122 Type 2 diabetes mellitus with diabetic chronic kidney disease: Secondary | ICD-10-CM | POA: Diagnosis present

## 2023-06-04 DIAGNOSIS — I150 Renovascular hypertension: Secondary | ICD-10-CM | POA: Diagnosis not present

## 2023-06-04 DIAGNOSIS — E785 Hyperlipidemia, unspecified: Secondary | ICD-10-CM

## 2023-06-04 DIAGNOSIS — Z1152 Encounter for screening for COVID-19: Secondary | ICD-10-CM | POA: Diagnosis not present

## 2023-06-04 DIAGNOSIS — N179 Acute kidney failure, unspecified: Secondary | ICD-10-CM | POA: Diagnosis present

## 2023-06-04 DIAGNOSIS — Z79899 Other long term (current) drug therapy: Secondary | ICD-10-CM | POA: Diagnosis not present

## 2023-06-04 DIAGNOSIS — I5031 Acute diastolic (congestive) heart failure: Secondary | ICD-10-CM

## 2023-06-04 DIAGNOSIS — I161 Hypertensive emergency: Secondary | ICD-10-CM | POA: Diagnosis present

## 2023-06-04 DIAGNOSIS — N184 Chronic kidney disease, stage 4 (severe): Secondary | ICD-10-CM | POA: Diagnosis present

## 2023-06-04 DIAGNOSIS — I130 Hypertensive heart and chronic kidney disease with heart failure and stage 1 through stage 4 chronic kidney disease, or unspecified chronic kidney disease: Secondary | ICD-10-CM | POA: Diagnosis present

## 2023-06-04 DIAGNOSIS — H548 Legal blindness, as defined in USA: Secondary | ICD-10-CM | POA: Diagnosis present

## 2023-06-04 DIAGNOSIS — N1832 Chronic kidney disease, stage 3b: Secondary | ICD-10-CM | POA: Diagnosis not present

## 2023-06-04 DIAGNOSIS — E119 Type 2 diabetes mellitus without complications: Secondary | ICD-10-CM | POA: Diagnosis not present

## 2023-06-04 DIAGNOSIS — E1165 Type 2 diabetes mellitus with hyperglycemia: Secondary | ICD-10-CM | POA: Diagnosis present

## 2023-06-04 DIAGNOSIS — R42 Dizziness and giddiness: Secondary | ICD-10-CM | POA: Diagnosis present

## 2023-06-04 DIAGNOSIS — I1A0 Resistant hypertension: Secondary | ICD-10-CM | POA: Diagnosis present

## 2023-06-04 DIAGNOSIS — I5033 Acute on chronic diastolic (congestive) heart failure: Secondary | ICD-10-CM | POA: Diagnosis present

## 2023-06-04 DIAGNOSIS — Z888 Allergy status to other drugs, medicaments and biological substances status: Secondary | ICD-10-CM | POA: Diagnosis not present

## 2023-06-04 LAB — CBC
HCT: 35.1 % — ABNORMAL LOW (ref 36.0–46.0)
Hemoglobin: 11.3 g/dL — ABNORMAL LOW (ref 12.0–15.0)
MCH: 27.5 pg (ref 26.0–34.0)
MCHC: 32.2 g/dL (ref 30.0–36.0)
MCV: 85.4 fL (ref 80.0–100.0)
Platelets: 228 10*3/uL (ref 150–400)
RBC: 4.11 MIL/uL (ref 3.87–5.11)
RDW: 13.2 % (ref 11.5–15.5)
WBC: 11 10*3/uL — ABNORMAL HIGH (ref 4.0–10.5)
nRBC: 0 % (ref 0.0–0.2)

## 2023-06-04 LAB — COMPREHENSIVE METABOLIC PANEL
ALT: 14 U/L (ref 0–44)
AST: 17 U/L (ref 15–41)
Albumin: 3.2 g/dL — ABNORMAL LOW (ref 3.5–5.0)
Alkaline Phosphatase: 104 U/L (ref 38–126)
Anion gap: 11 (ref 5–15)
BUN: 38 mg/dL — ABNORMAL HIGH (ref 8–23)
CO2: 18 mmol/L — ABNORMAL LOW (ref 22–32)
Calcium: 8.5 mg/dL — ABNORMAL LOW (ref 8.9–10.3)
Chloride: 105 mmol/L (ref 98–111)
Creatinine, Ser: 1.8 mg/dL — ABNORMAL HIGH (ref 0.44–1.00)
GFR, Estimated: 30 mL/min — ABNORMAL LOW (ref >60)
Glucose, Bld: 287 mg/dL — ABNORMAL HIGH (ref 70–99)
Potassium: 4 mmol/L (ref 3.5–5.1)
Sodium: 134 mmol/L — ABNORMAL LOW (ref 135–145)
Total Bilirubin: 0.7 mg/dL (ref 0.3–1.2)
Total Protein: 6.6 g/dL (ref 6.5–8.1)

## 2023-06-04 LAB — GLUCOSE, CAPILLARY
Glucose-Capillary: 365 mg/dL — ABNORMAL HIGH (ref 70–99)
Glucose-Capillary: 326 mg/dL — ABNORMAL HIGH (ref 70–99)
Glucose-Capillary: 265 mg/dL — ABNORMAL HIGH (ref 70–99)
Glucose-Capillary: 165 mg/dL — ABNORMAL HIGH (ref 70–99)
Glucose-Capillary: 128 mg/dL — ABNORMAL HIGH (ref 70–99)

## 2023-06-04 LAB — ECHOCARDIOGRAM COMPLETE
AR max vel: 1.52 cm2
AV Area VTI: 1.58 cm2
AV Area mean vel: 1.49 cm2
AV Mean grad: 7 mmHg
AV Peak grad: 12.5 mmHg
Ao pk vel: 1.77 m/s
Area-P 1/2: 2.67 cm2
Height: 77 in
MV VTI: 1.47 cm2
S' Lateral: 3.2 cm
Weight: 3527.36 oz

## 2023-06-04 LAB — LIPID PANEL
Cholesterol: 205 mg/dL — ABNORMAL HIGH (ref 0–200)
HDL: 61 mg/dL (ref >40)
LDL Cholesterol: 104 mg/dL — ABNORMAL HIGH (ref 0–99)
Total CHOL/HDL Ratio: 3.4 RATIO
Triglycerides: 202 mg/dL — ABNORMAL HIGH (ref <150)
VLDL: 40 mg/dL (ref 0–40)

## 2023-06-04 LAB — HEMOGLOBIN A1C
Hgb A1c MFr Bld: 7.8 % — ABNORMAL HIGH (ref 4.8–5.6)
Mean Plasma Glucose: 177.16 mg/dL

## 2023-06-04 LAB — HIV ANTIBODY (ROUTINE TESTING W REFLEX): HIV Screen 4th Generation wRfx: NONREACTIVE

## 2023-06-04 LAB — MRSA NEXT GEN BY PCR, NASAL: MRSA by PCR Next Gen: NOT DETECTED

## 2023-06-04 MED ORDER — LABETALOL HCL 100 MG PO TABS
200.0000 mg | ORAL_TABLET | Freq: Three times a day (TID) | ORAL | Status: DC
  Administered 2023-06-04 – 2023-06-06 (×8): 200 mg via ORAL
  Filled 2023-06-04 (×2): qty 1
  Filled 2023-06-04: qty 2
  Filled 2023-06-04 (×4): qty 1
  Filled 2023-06-04: qty 2

## 2023-06-04 MED ORDER — INSULIN GLARGINE-YFGN 100 UNIT/ML ~~LOC~~ SOLN
10.0000 [IU] | Freq: Two times a day (BID) | SUBCUTANEOUS | Status: DC
  Administered 2023-06-04 – 2023-06-06 (×5): 10 [IU] via SUBCUTANEOUS
  Filled 2023-06-04 (×7): qty 0.1

## 2023-06-04 MED ORDER — ROSUVASTATIN CALCIUM 10 MG PO TABS
10.0000 mg | ORAL_TABLET | Freq: Every day | ORAL | Status: DC
  Administered 2023-06-04 – 2023-06-06 (×3): 10 mg via ORAL
  Filled 2023-06-04 (×3): qty 1

## 2023-06-04 MED ORDER — HYDRALAZINE HCL 50 MG PO TABS
50.0000 mg | ORAL_TABLET | Freq: Three times a day (TID) | ORAL | Status: DC
  Administered 2023-06-04 – 2023-06-06 (×7): 50 mg via ORAL
  Filled 2023-06-04 (×7): qty 1

## 2023-06-04 MED ORDER — INSULIN ASPART 100 UNIT/ML IJ SOLN
4.0000 [IU] | Freq: Three times a day (TID) | INTRAMUSCULAR | Status: DC
  Administered 2023-06-04 – 2023-06-06 (×6): 4 [IU] via SUBCUTANEOUS

## 2023-06-04 MED ORDER — INSULIN ASPART 100 UNIT/ML IJ SOLN
0.0000 [IU] | Freq: Three times a day (TID) | INTRAMUSCULAR | Status: DC
  Administered 2023-06-04: 8 [IU] via SUBCUTANEOUS
  Administered 2023-06-04: 3 [IU] via SUBCUTANEOUS
  Administered 2023-06-04: 15 [IU] via SUBCUTANEOUS
  Administered 2023-06-05: 8 [IU] via SUBCUTANEOUS
  Administered 2023-06-05 – 2023-06-06 (×2): 3 [IU] via SUBCUTANEOUS

## 2023-06-04 MED ORDER — LABETALOL HCL 200 MG PO TABS: 200.0000 mg | ORAL_TABLET | Freq: Three times a day (TID) | ORAL | Status: DC

## 2023-06-04 MED ORDER — CHLORHEXIDINE GLUCONATE CLOTH 2 % EX PADS
6.0000 | MEDICATED_PAD | Freq: Every day | CUTANEOUS | Status: DC
  Administered 2023-06-05 – 2023-06-06 (×2): 6 via TOPICAL

## 2023-06-04 MED ORDER — NICARDIPINE HCL IN NACL 40-0.83 MG/200ML-% IV SOLN
3.0000 mg/h | INTRAVENOUS | Status: DC
  Administered 2023-06-04 (×2): 15 mg/h via INTRAVENOUS
  Administered 2023-06-04: 7.5 mg/h via INTRAVENOUS
  Filled 2023-06-04 (×5): qty 200

## 2023-06-04 NOTE — Progress Notes (Signed)
Echocardiogram 2D Echocardiogram has been performed.  Vetrano Lacy Eligah Anello RDCS 06/04/2023, 8:57 AM

## 2023-06-04 NOTE — Assessment & Plan Note (Signed)
Recent echocardiogram done in May 2024 with normal EF and diastolic dysfunction. BNP elevated at 421.9, lower extremity edema. Patient was recently started on p.o. Lasix but she was not taking it. -IV Lasix 20 mg twice daily -Strict intake and output -Daily weight and BMP

## 2023-06-04 NOTE — Assessment & Plan Note (Addendum)
Blood pressure remained elevated. -Continue with Cardene infusion -Add hydralazine as recommended on most recent cardiology visit at [facility] -Continue with amlodipine -Continue with labetalol -Renal arterial duplex ordered for resistant hypertension

## 2023-06-04 NOTE — Assessment & Plan Note (Signed)
Elevated total cholesterol and triglyceride, LDL of 104 with goal should be less than 70. -Starting on low-dose Crestor.

## 2023-06-04 NOTE — Assessment & Plan Note (Signed)
Blood glucose elevated above 200.  A1c of 7.8.  Patient was on metformin and Jardiance at home but was not very compliant. -Add Semglee 10 units twice daily -Moderate SSI -4 units with meal

## 2023-06-04 NOTE — Progress Notes (Addendum)
   Spoke with on-call cardiology Dr. Vira Blanco and discussed the case with him over phone. Cardiology recommended to resume oral labetalol 200 mg 3 times daily and wean down Cardene drip gradually over the course of next 24 hours.  Cardiology agrees to continue the Lasix 20 mg IV twice daily for now. Recommended formal consult to cardiology in the morning. -Blood pressure has been improved to 186/81 heart rate 79. - As systolic blood pressure has been improved to 255-188 which dropped more than 25% in last 4 hours.  -Starting oral labetalol 200 mg 3 times daily. -Follow-up with cardiology for further recommendation.  Appreciate input.   Tereasa Coop, MD [facility] 06/04/2023, 1:12 AM

## 2023-06-04 NOTE — Progress Notes (Signed)
Renal artery duplex has been completed. Preliminary results can be found in CV Proc through chart review.   06/04/23 12:09 PM Olen Cordial RVT

## 2023-06-04 NOTE — Plan of Care (Signed)
  Problem: Education: Goal: Knowledge of General Education information will improve Description: Including pain rating scale, medication(s)/side effects and non-pharmacologic comfort measures Outcome: Progressing   Problem: Clinical Measurements: Goal: Respiratory complications will improve Outcome: Progressing   Problem: Activity: Goal: Risk for activity intolerance will decrease Outcome: Progressing   Problem: Nutrition: Goal: Adequate nutrition will be maintained Outcome: Progressing   Problem: Elimination: Goal: Will not experience complications related to bowel motility Outcome: Progressing Goal: Will not experience complications related to urinary retention Outcome: Progressing   Problem: Clinical Measurements: Goal: Ability to maintain clinical measurements within normal limits will improve Outcome: Not Progressing Goal: Cardiovascular complication will be avoided Outcome: Not Progressing   Problem: Pain Managment: Goal: General experience of comfort will improve Outcome: Not Progressing

## 2023-06-04 NOTE — Progress Notes (Addendum)
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 In the ED alert awake oriented x 3, hypertensive with BP 208-242/72-108, heart rate 60s to 80s, not hypoxic. Labs with worsening of creatinine at 2, elevated BNP 421 troponin 14.  UA with proteinuria WBC 6-10.  CT head no acute finding but mild atrophy and chronic small vessel ischemic changes. Patient was given hydralazine 5 mg x 2. Admission requested for further management. In care-everywhere-she saw cardio Dr Flonnie Hailstone at [facility]  5/13 for  leg edema dyspnea w/ dizziness>meds placed on amlodipine 10, hydraalzine 50 tid, labetalol 200 tid, lasix 40 prn . But she is only taking omlesartan? TTE 02/06/23: LVEF 55-60% Moderate diastolic dysfunction [pseudonormal pattern] with elevated left atrial pressure.  CXR with no active cardiopulmonary disease.  Patient was started on Cardene infusion and IV Lasix.  9/26: Blood pressure remained elevated at 198/81.  Labs with blood glucose of 287, CO2 18, mild pseudohyponatremia secondary to hyperglycemia, some improvement of creatinine to 1.8, Mild leukocytosis at 11, A1c 7.8, lipid panel with elevated total cholesterol, triglyceride and LDL of 104.  Adding Semglee 10 units twice daily and making SSI to moderate. Patient remained on Cardene infusion, renal arterial duplex was ordered. Echocardiogram ordered-pending  Assessment and Plan: * Hypertensive emergency Blood pressure remained elevated. 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  A1c of 7.8.  Patient was on metformin and Jardiance at home but was not very compliant. -Add Semglee 10 units twice daily -Moderate SSI -4 units with meal  Acute kidney injury (HCC) History of CKD stage IIIb.  Last creatinine checked 1 year ago was 1.3. Renal ultrasound done in May 2024 was without any hydronephrosis or significant abnormality. Creatinine on admission was 2.0>>1.8 -Monitor renal function closely while she is being diuresed -Avoid nephrotoxins -Holding olmesartan and HCTZ  Acute on chronic heart failure with preserved ejection fraction (HFpEF) (HCC) Recent echocardiogram done in May 2024 with normal EF and diastolic dysfunction. BNP elevated at 421.9, lower extremity edema. Patient was recently started on p.o. Lasix but she was not taking it. -IV Lasix 20 mg twice daily -Strict intake and output -Daily weight and BMP  Hyperlipidemia Elevated total cholesterol and triglyceride, LDL of 104 with goal should be less than 70. -Starting on low-dose Crestor.   Subjective: Patient is feeling very weak and tired when seen today.  No chest pain or shortness of breath.  Per patient her PCP switched her back to olmesartan and HCTZ so she never took the blood pressure medications which were recently prescribed by cardiology.  Physical Exam: Vitals:   06/04/23 1045 06/04/23 1100 06/04/23 1115 06/04/23 1130  BP: (!) 171/59 (!) 163/53 (!) 149/50 (!) 155/50  Pulse: 74 69 66 69  Resp: 12 18 17 17   Temp:      TempSrc:      SpO2: 100% 98% 99% 100%  Weight:      Height:  General.  Well-developed lady, in no acute distress. Pulmonary.  Lungs clear bilaterally, normal respiratory effort. CV.  Regular rate and rhythm, no JVD, rub or murmur. Abdomen.  Soft, nontender, nondistended, BS  positive. CNS.  Alert and oriented .  No focal neurologic deficit. Extremities.  No edema, no cyanosis, pulses intact and symmetrical. Psychiatry.  Judgment and insight appears normal.   Data Reviewed: Prior data reviewed  Family Communication: Discussed with patient  Disposition: Status is: Inpatient Remains inpatient appropriate because: Severity of illness  Planned Discharge Destination: Home  DVT prophylaxis.  Subcu heparin Time spent: 50 minutes  This record has been created using Conservation officer, historic buildings. Errors have been sought and corrected,but may not always be located. Such creation errors do not reflect on the standard of care.   Author: Arnetha Courser, MD 06/04/2023 12:09 PM  For on call review [email or web address].

## 2023-06-04 NOTE — Assessment & Plan Note (Signed)
History of CKD stage IIIb.  Last creatinine checked 1 year ago was 1.3. Renal ultrasound done in May 2024 was without any hydronephrosis or significant abnormality. Creatinine on admission was 2.0>>1.8 -Monitor renal function closely while she is being diuresed -Avoid nephrotoxins -Holding olmesartan and HCTZ

## 2023-06-05 DIAGNOSIS — E119 Type 2 diabetes mellitus without complications: Secondary | ICD-10-CM | POA: Diagnosis not present

## 2023-06-05 DIAGNOSIS — N179 Acute kidney failure, unspecified: Secondary | ICD-10-CM | POA: Diagnosis not present

## 2023-06-05 DIAGNOSIS — I161 Hypertensive emergency: Secondary | ICD-10-CM | POA: Diagnosis not present

## 2023-06-05 DIAGNOSIS — N1832 Chronic kidney disease, stage 3b: Secondary | ICD-10-CM | POA: Diagnosis not present

## 2023-06-05 LAB — CBC
HCT: 38.9 % (ref 36.0–46.0)
Hemoglobin: 12.8 g/dL (ref 12.0–15.0)
MCH: 27.2 pg (ref 26.0–34.0)
MCHC: 32.9 g/dL (ref 30.0–36.0)
MCV: 82.6 fL (ref 80.0–100.0)
Platelets: 143 10*3/uL — ABNORMAL LOW (ref 150–400)
RBC: 4.71 MIL/uL (ref 3.87–5.11)
RDW: 13.4 % (ref 11.5–15.5)
WBC: 10.1 10*3/uL (ref 4.0–10.5)
nRBC: 0 % (ref 0.0–0.2)

## 2023-06-05 LAB — GLUCOSE, CAPILLARY
Glucose-Capillary: 261 mg/dL — ABNORMAL HIGH (ref 70–99)
Glucose-Capillary: 115 mg/dL — ABNORMAL HIGH (ref 70–99)
Glucose-Capillary: 165 mg/dL — ABNORMAL HIGH (ref 70–99)
Glucose-Capillary: 221 mg/dL — ABNORMAL HIGH (ref 70–99)

## 2023-06-05 LAB — COMPREHENSIVE METABOLIC PANEL
ALT: 13 U/L (ref 0–44)
AST: 14 U/L — ABNORMAL LOW (ref 15–41)
Albumin: 3 g/dL — ABNORMAL LOW (ref 3.5–5.0)
Alkaline Phosphatase: 93 U/L (ref 38–126)
Anion gap: 8 (ref 5–15)
BUN: 49 mg/dL — ABNORMAL HIGH (ref 8–23)
CO2: 21 mmol/L — ABNORMAL LOW (ref 22–32)
Calcium: 8.2 mg/dL — ABNORMAL LOW (ref 8.9–10.3)
Chloride: 106 mmol/L (ref 98–111)
Creatinine, Ser: 2.13 mg/dL — ABNORMAL HIGH (ref 0.44–1.00)
GFR, Estimated: 25 mL/min — ABNORMAL LOW (ref >60)
Glucose, Bld: 251 mg/dL — ABNORMAL HIGH (ref 70–99)
Potassium: 4.1 mmol/L (ref 3.5–5.1)
Sodium: 135 mmol/L (ref 135–145)
Total Bilirubin: 0.6 mg/dL (ref 0.3–1.2)
Total Protein: 6 g/dL — ABNORMAL LOW (ref 6.5–8.1)

## 2023-06-05 MED ORDER — NICARDIPINE HCL 20 MG PO CAPS
20.0000 mg | ORAL_CAPSULE | Freq: Three times a day (TID) | ORAL | Status: DC
  Filled 2023-06-05: qty 1

## 2023-06-05 MED ORDER — NICARDIPINE HCL 30 MG PO CAPS: 30.0000 mg | ORAL_CAPSULE | Freq: Three times a day (TID) | ORAL | Status: DC

## 2023-06-05 MED ORDER — AMLODIPINE BESYLATE 10 MG PO TABS
10.0000 mg | ORAL_TABLET | Freq: Every day | ORAL | Status: DC
  Administered 2023-06-05 – 2023-06-06 (×2): 10 mg via ORAL
  Filled 2023-06-05 (×2): qty 1

## 2023-06-05 NOTE — Consult Note (Addendum)
[facility] Nephrology Consultation Note  Requesting MD: Dr. Nelson Chimes, [place]at Reason for consult: CKD, HTN  HPI:  Kaitlin Boyer is a 67 y.o. female with past medical history significant for longstanding hypertension, type 2 diabetes with neuropathy, and chronic diastolic CHF, who is admitted for dizziness, seen as a consultation for the evaluation and management of hypertension and CKD. The patient presented on 9/25 because of generalized weakness, leg swelling and dizziness.  She was recently placed on olmesartan and hydrochlorothiazide for the management of blood pressure.  Patient states she was not feeling well after starting new medication. In the ER, the initial blood pressure was 202/108, in room air.  The lab consistent with creatinine level of 2.0, potassium 4.6, hyperglycemia.  The creatinine level was 1.3 almost a year ago.  The patient received IV antihypertensives in ER without much improvement in blood pressure therefore admitted for hypertensive emergency.  She required Cardene drip overnight and then transitioned to hydralazine, labetalol, amlodipine with significant improvement in blood pressure.  She also received Lasix for lower extremity edema as well. The urinalysis showed protein more than 300 presumably due to diabetic nephropathy, no RBC.  US duplex negative for renal artery stenosis. She denied use of NSAIDs and no other nephrotoxins identified.  She denies fever, chills, nausea, vomiting, chest pain, shortness of breath.  No urinary complaint. Home medication including olmesartan and Jardiance held on admission.  PMHx:   Past Medical History:  Diagnosis Date   Diabetes mellitus without complication (HCC)    Hypertension     Past Surgical History:  Procedure Laterality Date   ABDOMINAL HYSTERECTOMY     CHOLECYSTECTOMY      Family Hx: History reviewed. No pertinent family history.  Social History:  reports that she has never smoked. She has never  used smokeless tobacco. She reports current alcohol use. She reports that she does not use drugs.  Allergies:  Allergies  Allergen Reactions   Glipizide Other (See Comments)    Vertigo    Lisinopril Other (See Comments)    Vertigo- Almost passed out- had to go the the ED    Medications: Prior to Admission medications   Medication Sig Start Date End Date Taking? Authorizing Provider  olmesartan (BENICAR) 40 MG tablet Take 40 mg by mouth daily.   Yes [provider]  TYLENOL 325 MG tablet Take 325 mg by mouth every 6 (six) hours as needed for mild pain or headache.   Yes [provider]  cetirizine (ZYRTEC ALLERGY) 10 MG tablet Take 1 tablet (10 mg total) by mouth daily. Patient not taking: Reported on 06/04/2023 05/28/22   Gailen Shelter, PA  empagliflozin (JARDIANCE) 10 MG TABS tablet Take 1 tablet (10 mg total) by mouth daily before breakfast. Patient not taking: Reported on 06/04/2023 01/27/22   Jacalyn Lefevre, MD  fluticasone [facility]) 50 MCG/ACT nasal spray Place 2 sprays into both nostrils daily for 14 days. Patient not taking: Reported on 06/04/2023 05/28/22 06/04/23  Gailen Shelter, PA  glipiZIDE 2.5 MG TABS Take 2.5 mg by mouth daily. Patient not taking: Reported on 06/04/2023    [provider]  hydrALAZINE (APRESOLINE) 25 MG tablet Take 25 mg by mouth 3 (three) times daily. Patient not taking: Reported on 06/04/2023    [provider]  meclizine (ANTIVERT) 25 MG tablet Take 1 tablet (25 mg total) by mouth 3 (three) times daily as needed for dizziness. Patient not taking: Reported on 06/04/2023 06/14/21   Gwyneth Sprout,  MD    I have reviewed the patient's current medications.  Labs: Renal Panel: Recent Labs  Lab 06/03/23 1610 06/04/23 0247 06/05/23 0615  NA 138 134* 135  K 4.6 4.0 4.1  CL 108 105 106  CO2 24 18* 21*  GLUCOSE 238* 287* 251*  BUN 39* 38* 49*  CREATININE 2.00* 1.80* 2.13*  CALCIUM 8.7* 8.5* 8.2*     CBC:     Latest Ref Rng & Units 06/05/2023    2:49 AM 06/04/2023    2:47 AM 06/03/2023    4:10 PM  CBC  WBC 4.0 - 10.5 K/uL 10.1  11.0  9.3   Hemoglobin 12.0 - 15.0 g/dL 16.1  09.6  04.5   Hematocrit 36.0 - 46.0 % 38.9  35.1  33.7   Platelets 150 - 400 K/uL 143  228  237      Anemia Panel:  Recent Labs    06/03/23 1610 06/04/23 0247 06/05/23 0249  HGB 11.3* 11.3* 12.8  MCV 82.0 85.4 82.6    Recent Labs  Lab 06/03/23 1610 06/04/23 0247 06/05/23 0615  AST 15 17 14*  ALT 13 14 13   ALKPHOS 108 104 93  BILITOT 0.5 0.7 0.6  PROT 7.1 6.6 6.0*  ALBUMIN 3.5 3.2* 3.0*    Lab Results  Component Value Date   HGBA1C 7.8 (H) 06/04/2023    ROS:  Pertinent items noted in HPI and remainder of comprehensive ROS otherwise negative.  Physical Exam: Vitals:   06/05/23 1200 06/05/23 1511  BP:  (!) 168/94  Pulse:  65  Resp:    Temp: 98.2 F (36.8 C)   SpO2:       General exam: Appears calm and comfortable  Respiratory system: Clear to auscultation. Respiratory effort normal. No wheezing or crackle Cardiovascular system: S1 & S2 heard, RRR.  No pedal edema. Gastrointestinal system: Abdomen is nondistended, soft and nontender. Normal bowel sounds heard. Central nervous system: Alert and oriented. No focal neurological deficits. Extremities: Symmetric 5 x 5 power. Skin: No rashes, lesions or ulcers Psychiatry: Judgement and insight appear normal. Mood & affect appropriate.   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  She may have some AKI because of hypertensive emergency concomitant with recent use of ARB and Jardiance.  The serum creatinine level seems to be fluctuating anywhere between 1.8-2 here.  The creatinine level was 1.3 almost a year ago.  UA with chronic protein due to diabetes, US duplex unremarkable and ruled out RAS. I agree with holding olmesartan and Jardiance  during hospitalization. I think she will need to follow-up outpatient with nephrologist to manage her CKD.  I have discussed this with the patient and provided clinic information.  I will send information to our office to arrange outpatient follow-up.  # Hypertensive emergency on admission: Initially required Cardene drip.  The repeat blood pressure at the bedside showed blood pressure of 143/60.  She is currently on amlodipine, hydralazine and labetalol.  I advised her to monitor BP and can titrate antihypertensive according to the blood pressure readings.  # Peripheral edema/CHF: Improved with dose of Lasix.  The Lasix is currently on hold because of renal failure which can be resumed as outpatient.  Recommend low-sodium diet.  # Type II DM: With neuropathy.  Recommend to hold Jardiance and ARB during this hospitalization.  Follow-up with PCP and goal A1c less than 7.  Nothing further to add,  Sign off, please call us back with question.  Scotty Weigelt Jaynie Collins 06/05/2023, 4:11 PM  [facility].

## 2023-06-05 NOTE — Assessment & Plan Note (Signed)
Blood pressure improving, able to wean off from Cardene infusion. Renal arterial duplex with 1 to 59% of bilateral stenosis -Add amlodipine 10 mg daily -Add hydralazine as recommended on most recent cardiology visit at [facility] -Continue with amlodipine -Continue with labetalol -Continue to monitor

## 2023-06-05 NOTE — Care Management (Signed)
Transition of Care [facility]) - Inpatient Brief Assessment   Patient Details  Name: Kaitlin Boyer MRN: [id number] Date of Birth: June 13, 1956  Transition of Care [facility]) CM/SW Contact:    Lavenia Atlas, RN Phone Number: 06/05/2023, 12:03 PM   Clinical Narrative: Per chart review patient currently in [facility] SDU for hypertensive emergency. Patient sees cardiologist Sarah Shen/[facility], updated chart. Patient not appropriates for HF screen protocol.   Transition of Care [facility]) Department has reviewed patient and no TOC needs have been identified at this time. We will continue to monitor patient advancement through Interdisciplinary progressions and if new patient needs arise, please place a consult.    Transition of Care Asessment: Insurance and Status: Insurance coverage has been reviewed Patient has primary care physician: Yes Home environment has been reviewed: from home alone Prior level of function:: independent Prior/Current Home Services: No current home services Social Determinants of Health Reivew: SDOH reviewed no interventions necessary Readmission risk has been reviewed: Yes Transition of care needs: no transition of care needs at this time

## 2023-06-05 NOTE — Assessment & Plan Note (Signed)
Recent echocardiogram done in May 2024 with normal EF and diastolic dysfunction. BNP elevated at 421.9, lower extremity edema. Repeat echocardiogram with normal EF, mild to moderate concentric LVH and mild mitral stenosis Patient was recently started on p.o. Lasix but she was not taking it. -Holding IV Lasix due to worsening renal function -Strict intake and output -Daily weight and BMP

## 2023-06-05 NOTE — Plan of Care (Signed)

## 2023-06-05 NOTE — Inpatient Diabetes Management (Signed)
Inpatient Diabetes Program Recommendations  AACE/ADA: New Consensus Statement on Inpatient Glycemic Control (2015)  Target Ranges:  Prepandial:   less than 140 mg/dL      Peak postprandial:   less than 180 mg/dL (1-2 hours)      Critically ill patients:  140 - 180 mg/dL    Latest Reference Range & Units 06/04/23 02:47  Hemoglobin A1C 4.8 - 5.6 % 7.8 (H)  (H): Data is abnormally high  Latest Reference Range & Units 06/04/23 07:57 06/04/23 09:09 06/04/23 11:16 06/04/23 16:22 06/04/23 21:29  Glucose-Capillary 70 - 99 mg/dL 865 (H)  19 units Novolog  326 (H)   10 units Semglee 265 (H)  12 units Novolog  165 (H)  7 units Novolog  128 (H)   10 units Semglee  (H): Data is abnormally high  Latest Reference Range & Units 06/05/23 07:39  Glucose-Capillary 70 - 99 mg/dL 784 (H)  12 units Novolog  10 units Semglee  (H): Data is abnormally high   Home DM Meds: Metformin (Glumetza) 500 BID     Jardiance 10 mg QAM   Current Orders: Semglee 10 units  BID    Novolog 0-15 units TID ac/hs    Novolog 4 units TID with meals   MD- Note CBG 261 this AM.    Please consider increasing the Semglee to 13 units BID (30% increase)    --Will follow patient during hospitalization--  Ambrose Finland RN, MSN, CDCES Diabetes Coordinator Inpatient Glycemic Control Team Team Pager: [phone] (8a-5p)

## 2023-06-05 NOTE — Progress Notes (Signed)
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 In the ED alert awake oriented x 3, hypertensive with BP 208-242/72-108, heart rate 60s to 80s, not hypoxic. Labs with worsening of creatinine at 2, elevated BNP 421 troponin 14.  UA with proteinuria WBC 6-10.  CT head no acute finding but mild atrophy and chronic small vessel ischemic changes. Patient was given hydralazine 5 mg x 2. Admission requested for further management. In care-everywhere-she saw cardio Dr Flonnie Hailstone at [facility]  5/13 for  leg edema dyspnea w/ dizziness>meds placed on amlodipine 10, hydraalzine 50 tid, labetalol 200 tid, lasix 40 prn . But she is only taking omlesartan? TTE 02/06/23: LVEF 55-60% Moderate diastolic dysfunction [pseudonormal pattern] with elevated left atrial pressure.  CXR with no active cardiopulmonary disease.  Patient was started on Cardene infusion and IV Lasix.  9/26: Blood pressure remained elevated at 198/81.  Labs with blood glucose of 287, CO2 18, mild pseudohyponatremia secondary to hyperglycemia, some improvement of creatinine to 1.8, Mild leukocytosis at 11, A1c 7.8, lipid panel with elevated total cholesterol, triglyceride and LDL of 104.  Adding Semglee 10 units twice daily and making SSI to moderate. Patient remained on Cardene infusion, renal arterial duplex was ordered. Echocardiogram ordered-pending  9/27: Blood pressure improving, able to wean off from Cardene infusion.  Echocardiogram with normal EF, moderate concentric LVH and mild mitral stenosis.  Renal arterial duplex with 1 to 59% of bilateral stenosis which  is likely not the cause of her resistant hypertension. Slight worsening of creatinine-nephrology was consulted.  Adding amlodipine to her regimen  Assessment and Plan: * Hypertensive emergency Blood pressure improving, able to wean off from Cardene infusion. Renal arterial duplex with 1 to 59% of bilateral stenosis -Add amlodipine 10 mg daily -Add hydralazine as recommended on most recent cardiology visit at [facility] -Continue with amlodipine -Continue with labetalol -Continue to monitor  Non-insulin treated type 2 diabetes mellitus (HCC) Blood glucose elevated above 200.  A1c of 7.8.  Patient was on metformin and Jardiance at home but was not very compliant. -Add Semglee 10 units twice daily -Moderate SSI -4 units with meal  Acute kidney injury (HCC) History of CKD stage IIIb.  Last creatinine checked 1 year ago was 1.3. Renal ultrasound done in May 2024 was without any hydronephrosis or significant abnormality. Creatinine on admission was 2.0>>1.8>2.13 -Holding Lasix -Nephrology consult -Monitor renal function  -Avoid nephrotoxins -Holding olmesartan and HCTZ  Acute on chronic heart failure with preserved ejection fraction (HFpEF) (HCC) Recent echocardiogram done in May 2024 with normal EF and diastolic dysfunction. BNP elevated at 421.9, lower extremity edema. Repeat echocardiogram with normal EF, mild to moderate concentric LVH and mild mitral stenosis Patient was recently started on p.o. Lasix but she was not taking it. -Holding IV Lasix due to worsening renal function -Strict intake and output -Daily weight and BMP  Hyperlipidemia Elevated total cholesterol and triglyceride, LDL of 104 with goal should be less than 70. -Starting on low-dose Crestor.   Subjective: Patient was feeling little improved when seen today.  No new concern.  Counseling is provided to stay compliant with antihypertensives.  Apparently was  not taking regularly as olmesartan with glipizide was  causing some nausea.  Physical Exam: Vitals:   06/05/23 0839 06/05/23 0926 06/05/23 1200 06/05/23 1511  BP: (!) 197/61 (!) 189/62  (!) 168/94  Pulse: 74   65  Resp: 13     Temp:   98.2 F (36.8 C)   TempSrc:   Oral   SpO2: 97%     Weight:      Height:       General.  Well-developed lady, in no acute distress. Pulmonary.  Lungs clear bilaterally, normal respiratory effort. CV.  Regular rate and rhythm, no JVD, rub or murmur. Abdomen.  Soft, nontender, nondistended, BS positive. CNS.  Alert and oriented .  No focal neurologic deficit. Extremities.  No edema, no cyanosis, pulses intact and symmetrical. Psychiatry.  Judgment and insight appears normal.    Data Reviewed: Prior data reviewed  Family Communication: Discussed with patient  Disposition: Status is: Inpatient Remains inpatient appropriate because: Severity of illness  Planned Discharge Destination: Home  DVT prophylaxis.  Subcu heparin Time spent: 44 minutes  This record has been created using Conservation officer, historic buildings. Errors have been sought and corrected,but may not always be located. Such creation errors do not reflect on the standard of care.   Author: Arnetha Courser, MD 06/05/2023 4:05 PM  For on call review [email or web address].

## 2023-06-05 NOTE — Assessment & Plan Note (Signed)
History of CKD stage IIIb.  Last creatinine checked 1 year ago was 1.3. Renal ultrasound done in May 2024 was without any hydronephrosis or significant abnormality. Creatinine on admission was 2.0>>1.8>2.13 -Holding Lasix -Nephrology consult -Monitor renal function  -Avoid nephrotoxins -Holding olmesartan and HCTZ

## 2023-06-06 DIAGNOSIS — E119 Type 2 diabetes mellitus without complications: Secondary | ICD-10-CM | POA: Diagnosis not present

## 2023-06-06 DIAGNOSIS — I161 Hypertensive emergency: Secondary | ICD-10-CM | POA: Diagnosis not present

## 2023-06-06 DIAGNOSIS — N179 Acute kidney failure, unspecified: Secondary | ICD-10-CM | POA: Diagnosis not present

## 2023-06-06 DIAGNOSIS — N1832 Chronic kidney disease, stage 3b: Secondary | ICD-10-CM | POA: Diagnosis not present

## 2023-06-06 LAB — CBC
HCT: 29.9 % — ABNORMAL LOW (ref 36.0–46.0)
Hemoglobin: 9.9 g/dL — ABNORMAL LOW (ref 12.0–15.0)
MCH: 28.1 pg (ref 26.0–34.0)
MCHC: 33.1 g/dL (ref 30.0–36.0)
MCV: 84.9 fL (ref 80.0–100.0)
Platelets: 188 10*3/uL (ref 150–400)
RBC: 3.52 MIL/uL — ABNORMAL LOW (ref 3.87–5.11)
RDW: 13.5 % (ref 11.5–15.5)
WBC: 8.6 10*3/uL (ref 4.0–10.5)
nRBC: 0 % (ref 0.0–0.2)

## 2023-06-06 LAB — COMPREHENSIVE METABOLIC PANEL
ALT: 12 U/L (ref 0–44)
AST: 13 U/L — ABNORMAL LOW (ref 15–41)
Albumin: 3 g/dL — ABNORMAL LOW (ref 3.5–5.0)
Alkaline Phosphatase: 91 U/L (ref 38–126)
Anion gap: 9 (ref 5–15)
BUN: 46 mg/dL — ABNORMAL HIGH (ref 8–23)
CO2: 21 mmol/L — ABNORMAL LOW (ref 22–32)
Calcium: 8.5 mg/dL — ABNORMAL LOW (ref 8.9–10.3)
Chloride: 108 mmol/L (ref 98–111)
Creatinine, Ser: 1.94 mg/dL — ABNORMAL HIGH (ref 0.44–1.00)
GFR, Estimated: 28 mL/min — ABNORMAL LOW (ref >60)
Glucose, Bld: 160 mg/dL — ABNORMAL HIGH (ref 70–99)
Potassium: 4.2 mmol/L (ref 3.5–5.1)
Sodium: 138 mmol/L (ref 135–145)
Total Bilirubin: 0.8 mg/dL (ref 0.3–1.2)
Total Protein: 6.1 g/dL — ABNORMAL LOW (ref 6.5–8.1)

## 2023-06-06 LAB — GLUCOSE, CAPILLARY: Glucose-Capillary: 164 mg/dL — ABNORMAL HIGH (ref 70–99)

## 2023-06-06 MED ORDER — HYDRALAZINE HCL 50 MG PO TABS: 50.0000 mg | ORAL_TABLET | Freq: Three times a day (TID) | ORAL | 2 refills | Status: AC

## 2023-06-06 MED ORDER — ROSUVASTATIN CALCIUM 10 MG PO TABS: 10.0000 mg | ORAL_TABLET | Freq: Every day | ORAL | 1 refills | Status: AC

## 2023-06-06 MED ORDER — LABETALOL HCL 200 MG PO TABS: 200.0000 mg | ORAL_TABLET | Freq: Three times a day (TID) | ORAL | 1 refills | Status: AC

## 2023-06-06 MED ORDER — AMLODIPINE BESYLATE 10 MG PO TABS: 10.0000 mg | ORAL_TABLET | Freq: Every day | ORAL | 2 refills | Status: AC

## 2023-06-06 NOTE — TOC Transition Note (Signed)
Transition of Care [facility]) - CM/SW Discharge Note   Patient Details  Name: Kaitlin Boyer MRN: [id number] Date of Birth: 02/11/1956  [facility]) CM/SW Contact:  Darleene Cleaver, LCSW Phone Number: 06/06/2023, 11:56 AM   Clinical Narrative:     Patient discharging back home, no TOC needs per TOC note from yesterday.  Final next level of care: Home/Self Care Barriers to Discharge: Barriers Resolved   Patient Goals and CMS Choice      Discharge Placement    Patient discharging back home.                     Discharge Plan and Services Additional resources added to the After Visit Summary for                                       Social Determinants of Health (SDOH) Interventions SDOH Screenings   Food Insecurity: No Food Insecurity (06/04/2023)  Housing: Low Risk  (06/04/2023)  Transportation Needs: No Transportation Needs (06/04/2023)  Utilities: Not At Risk (06/04/2023)  Tobacco Use: Low Risk  (06/03/2023)     Readmission Risk Interventions    06/05/2023   12:03 PM  Readmission Risk Prevention Plan  Post Dischage Appt Complete  Medication Screening Complete  Transportation Screening Complete

## 2023-06-06 NOTE — Discharge Summary (Signed)
Physician Discharge Summary   Patient: ABBEE Boyer MRN: [id number] DOB: 07/29/1956  Admit date:     06/03/2023  Discharge date: 06/06/23  Discharge Physician: Kaitlin Boyer   PCP: Kaitlin Beam, MD   Recommendations at discharge:  Please obtain CBC and renal function on follow-up Please add insulin if needed for better control of diabetes, patient is currently on glipizide only, Jardiance was discontinued due to concern of worsening renal function Follow-up with primary care provider within a week Follow-up with nephrology  Discharge Diagnoses: Principal Problem:   Hypertensive emergency Active Problems:   Non-insulin treated type 2 diabetes mellitus (HCC)   AKI (acute kidney injury) (HCC)   CKD (chronic kidney disease) stage 3, GFR 30-59 ml/min (HCC)   Acute on chronic heart failure with preserved ejection fraction (HFpEF) (HCC)   Hyperlipidemia   Dizziness   Hospital Course: 67 year old female with CKD 3B b/l creatinine~1.3 on September 2023, hypertension, Chronic diastolic CHF,T2DM on metformin/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 In the ED alert awake oriented x 3, hypertensive with BP 208-242/72-108, heart rate 60s to 80s, not hypoxic. Labs with worsening of creatinine at 2, elevated BNP 421 troponin 14.  UA with proteinuria WBC 6-10.  CT head no acute finding but mild atrophy and chronic small vessel ischemic changes. Patient was given hydralazine 5 mg x 2. Admission requested for further management. In care-everywhere-she saw cardio Dr Kaitlin Boyer at [facility]  5/13 for  leg edema dyspnea w/ dizziness>meds placed on amlodipine 10, hydraalzine 50 tid, labetalol 200 tid, lasix 40 prn . But she is only taking omlesartan? TTE 02/06/23: LVEF 55-60% Moderate diastolic dysfunction [pseudonormal pattern] with elevated left atrial pressure.  CXR with no active cardiopulmonary  disease.  Patient was started on Cardene infusion and IV Lasix.  9/26: Blood pressure remained elevated at 198/81.  Labs with blood glucose of 287, CO2 18, mild pseudohyponatremia secondary to hyperglycemia, some improvement of creatinine to 1.8, Mild leukocytosis at 11, A1c 7.8, lipid panel with elevated total cholesterol, triglyceride and LDL of 104.  Adding Semglee 10 units twice daily and making SSI to moderate. Patient remained on Cardene infusion, renal arterial duplex was ordered. Echocardiogram ordered-pending  9/27: Blood pressure improving, able to wean off from Cardene infusion.  Echocardiogram with normal EF, moderate concentric LVH and mild mitral stenosis.  Renal arterial duplex with 1 to 59% of bilateral stenosis which is likely not the cause of her resistant hypertension. Slight worsening of creatinine-nephrology was consulted.  Adding amlodipine to her regimen.  9/28: Blood pressure improving, although still mildly elevated.  Nephrology also saw her and renal function currently seems stable.  Either this is a mild AKI with underlying CKD stage IIIb or she has progressed to CKD stage IV and nephrology will determine that as outpatient. Her Jardiance and olmesartan has been discontinued.  Patient is being discharged on glipizide for her diabetes, she might need insulin if need to add another agent and her PCP should be able to provide if needed.  She is being discharged on amlodipine 10 mg daily, labetalol 200 mg 3 times a day and hydralazine 50 mg 3 times a day for now.  Nephrology will have a close follow-up and manage her antihypertensives as outpatient.  Patient will continue on current medications and need to have a close follow-up with her providers for further recommendations.  Assessment and Plan: * Hypertensive  80      9            +-----------------+--------+--------+-------+ Proximal            61      6            +-----------------+--------+--------+-------+ Mid                 92      15           +-----------------+--------+--------+-------+ Distal              85      20           +-----------------+--------+--------+-------+ +------------+--------+--------+----+-----------+--------+--------+----+ Right KidneyPSV cm/sEDV cm/sRI  Left KidneyPSV cm/sEDV cm/sRI   +------------+--------+--------+----+-----------+--------+--------+----+ Upper Pole  21      5       0.75Upper Pole 37      7       0.82 +------------+--------+--------+----+-----------+--------+--------+----+ Mid         20      4       0.        37      7       0.81 +------------+--------+--------+----+-----------+--------+--------+----+ Lower Pole  47      7       0.84Lower Pole 38      6       0.84 +------------+--------+--------+----+-----------+--------+--------+----+ Hilar       31      5       0.83Hilar      59      13      0.78 +------------+--------+--------+----+-----------+--------+--------+----+ +------------------+-----+------------------+-----+ Right Kidney           Left Kidney             +------------------+-----+------------------+-----+ RAR                    RAR                      +------------------+-----+------------------+-----+ RAR (manual)      0.44 RAR (manual)      0.7   +------------------+-----+------------------+-----+ Cortex                 Cortex                  +------------------+-----+------------------+-----+ Cortex thickness       Corex thickness         +------------------+-----+------------------+-----+ Kidney length (cm)10.50Kidney length (cm)10.70 +------------------+-----+------------------+-----+  Summary: Renal:  Right: Normal size right kidney. Normal right Resisitive Index.        1-59% stenosis of the right renal artery. Left:  Normal size of left kidney. Normal left Resistive Index.        1-59% stenosis of the left renal artery.  *See table(s) above for measurements and observations.  Diagnosing physician: Kaitlin Boyer  Electronically signed by Kaitlin Boyer on 06/04/2023 at 5:09:44 PM.    Final    ECHOCARDIOGRAM COMPLETE  Result Date: 06/04/2023    ECHOCARDIOGRAM REPORT   Patient Name:   Kaitlin Boyer Date of Exam: 06/04/2023 Medical Rec #:  [id number]        Height:       77.0 in Accession #:    [id number]       Weight:       220.5 lb Date of Birth:  07-23-1956       BSA:  Physician Discharge Summary   Patient: ABBEE Boyer MRN: [id number] DOB: 07/29/1956  Admit date:     06/03/2023  Discharge date: 06/06/23  Discharge Physician: Kaitlin Boyer   PCP: Kaitlin Beam, MD   Recommendations at discharge:  Please obtain CBC and renal function on follow-up Please add insulin if needed for better control of diabetes, patient is currently on glipizide only, Jardiance was discontinued due to concern of worsening renal function Follow-up with primary care provider within a week Follow-up with nephrology  Discharge Diagnoses: Principal Problem:   Hypertensive emergency Active Problems:   Non-insulin treated type 2 diabetes mellitus (HCC)   AKI (acute kidney injury) (HCC)   CKD (chronic kidney disease) stage 3, GFR 30-59 ml/min (HCC)   Acute on chronic heart failure with preserved ejection fraction (HFpEF) (HCC)   Hyperlipidemia   Dizziness   Hospital Course: 67 year old female with CKD 3B b/l creatinine~1.3 on September 2023, hypertension, Chronic diastolic CHF,T2DM on metformin/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 In the ED alert awake oriented x 3, hypertensive with BP 208-242/72-108, heart rate 60s to 80s, not hypoxic. Labs with worsening of creatinine at 2, elevated BNP 421 troponin 14.  UA with proteinuria WBC 6-10.  CT head no acute finding but mild atrophy and chronic small vessel ischemic changes. Patient was given hydralazine 5 mg x 2. Admission requested for further management. In care-everywhere-she saw cardio Dr Kaitlin Boyer at [facility]  5/13 for  leg edema dyspnea w/ dizziness>meds placed on amlodipine 10, hydraalzine 50 tid, labetalol 200 tid, lasix 40 prn . But she is only taking omlesartan? TTE 02/06/23: LVEF 55-60% Moderate diastolic dysfunction [pseudonormal pattern] with elevated left atrial pressure.  CXR with no active cardiopulmonary  disease.  Patient was started on Cardene infusion and IV Lasix.  9/26: Blood pressure remained elevated at 198/81.  Labs with blood glucose of 287, CO2 18, mild pseudohyponatremia secondary to hyperglycemia, some improvement of creatinine to 1.8, Mild leukocytosis at 11, A1c 7.8, lipid panel with elevated total cholesterol, triglyceride and LDL of 104.  Adding Semglee 10 units twice daily and making SSI to moderate. Patient remained on Cardene infusion, renal arterial duplex was ordered. Echocardiogram ordered-pending  9/27: Blood pressure improving, able to wean off from Cardene infusion.  Echocardiogram with normal EF, moderate concentric LVH and mild mitral stenosis.  Renal arterial duplex with 1 to 59% of bilateral stenosis which is likely not the cause of her resistant hypertension. Slight worsening of creatinine-nephrology was consulted.  Adding amlodipine to her regimen.  9/28: Blood pressure improving, although still mildly elevated.  Nephrology also saw her and renal function currently seems stable.  Either this is a mild AKI with underlying CKD stage IIIb or she has progressed to CKD stage IV and nephrology will determine that as outpatient. Her Jardiance and olmesartan has been discontinued.  Patient is being discharged on glipizide for her diabetes, she might need insulin if need to add another agent and her PCP should be able to provide if needed.  She is being discharged on amlodipine 10 mg daily, labetalol 200 mg 3 times a day and hydralazine 50 mg 3 times a day for now.  Nephrology will have a close follow-up and manage her antihypertensives as outpatient.  Patient will continue on current medications and need to have a close follow-up with her providers for further recommendations.  Assessment and Plan: * Hypertensive  Physician Discharge Summary   Patient: ABBEE Boyer MRN: [id number] DOB: 07/29/1956  Admit date:     06/03/2023  Discharge date: 06/06/23  Discharge Physician: Kaitlin Boyer   PCP: Kaitlin Beam, MD   Recommendations at discharge:  Please obtain CBC and renal function on follow-up Please add insulin if needed for better control of diabetes, patient is currently on glipizide only, Jardiance was discontinued due to concern of worsening renal function Follow-up with primary care provider within a week Follow-up with nephrology  Discharge Diagnoses: Principal Problem:   Hypertensive emergency Active Problems:   Non-insulin treated type 2 diabetes mellitus (HCC)   AKI (acute kidney injury) (HCC)   CKD (chronic kidney disease) stage 3, GFR 30-59 ml/min (HCC)   Acute on chronic heart failure with preserved ejection fraction (HFpEF) (HCC)   Hyperlipidemia   Dizziness   Hospital Course: 67 year old female with CKD 3B b/l creatinine~1.3 on September 2023, hypertension, Chronic diastolic CHF,T2DM on metformin/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 In the ED alert awake oriented x 3, hypertensive with BP 208-242/72-108, heart rate 60s to 80s, not hypoxic. Labs with worsening of creatinine at 2, elevated BNP 421 troponin 14.  UA with proteinuria WBC 6-10.  CT head no acute finding but mild atrophy and chronic small vessel ischemic changes. Patient was given hydralazine 5 mg x 2. Admission requested for further management. In care-everywhere-she saw cardio Dr Kaitlin Boyer at [facility]  5/13 for  leg edema dyspnea w/ dizziness>meds placed on amlodipine 10, hydraalzine 50 tid, labetalol 200 tid, lasix 40 prn . But she is only taking omlesartan? TTE 02/06/23: LVEF 55-60% Moderate diastolic dysfunction [pseudonormal pattern] with elevated left atrial pressure.  CXR with no active cardiopulmonary  disease.  Patient was started on Cardene infusion and IV Lasix.  9/26: Blood pressure remained elevated at 198/81.  Labs with blood glucose of 287, CO2 18, mild pseudohyponatremia secondary to hyperglycemia, some improvement of creatinine to 1.8, Mild leukocytosis at 11, A1c 7.8, lipid panel with elevated total cholesterol, triglyceride and LDL of 104.  Adding Semglee 10 units twice daily and making SSI to moderate. Patient remained on Cardene infusion, renal arterial duplex was ordered. Echocardiogram ordered-pending  9/27: Blood pressure improving, able to wean off from Cardene infusion.  Echocardiogram with normal EF, moderate concentric LVH and mild mitral stenosis.  Renal arterial duplex with 1 to 59% of bilateral stenosis which is likely not the cause of her resistant hypertension. Slight worsening of creatinine-nephrology was consulted.  Adding amlodipine to her regimen.  9/28: Blood pressure improving, although still mildly elevated.  Nephrology also saw her and renal function currently seems stable.  Either this is a mild AKI with underlying CKD stage IIIb or she has progressed to CKD stage IV and nephrology will determine that as outpatient. Her Jardiance and olmesartan has been discontinued.  Patient is being discharged on glipizide for her diabetes, she might need insulin if need to add another agent and her PCP should be able to provide if needed.  She is being discharged on amlodipine 10 mg daily, labetalol 200 mg 3 times a day and hydralazine 50 mg 3 times a day for now.  Nephrology will have a close follow-up and manage her antihypertensives as outpatient.  Patient will continue on current medications and need to have a close follow-up with her providers for further recommendations.  Assessment and Plan: * Hypertensive  appointment as soon as possible for a visit in 1 week(s).   Specialty: Family Medicine Contact information: [address]  [address] [place] [address] [phone]                Discharge Exam: Ceasar Mons Weights   06/04/23 0707 06/05/23 0500 06/06/23 0730  Weight: 101.3 kg 100.2 kg 101 kg   General.  Well-developed lady, in no acute distress. Pulmonary.  Lungs clear bilaterally, normal respiratory effort. CV.  Regular rate and rhythm, no JVD, rub or murmur. Abdomen.  Soft, nontender, nondistended, BS positive. CNS.  Alert and oriented .  No focal neurologic deficit. Extremities.  No edema, no cyanosis, pulses  intact and symmetrical. Psychiatry.  Judgment and insight appears normal.   Condition at discharge: stable  The results of significant diagnostics from this hospitalization (including imaging, microbiology, ancillary and laboratory) are listed below for reference.   Imaging Studies: VAS US RENAL ARTERY DUPLEX  Result Date: 06/04/2023 ABDOMINAL VISCERAL Patient Name:  GRAYSON WHITE  Date of Exam:   06/04/2023 Medical Rec #: [id number]         Accession #:    [id number] Date of Birth: [birth year]        Patient Gender: F Patient Age:   65 years Exam Location:  [facility] Procedure:      VAS US RENAL ARTERY DUPLEX Referring Phys: [id number] Katlin Bortner -------------------------------------------------------------------------------- Indications: Resistant hypertension High Risk Factors: Hypertension, Diabetes. Limitations: Respiratory variation, patient movement. Comparison Study: No prior studies. Performing Technologist: Chanda Busing RVT  Examination Guidelines: A complete evaluation includes B-mode imaging, spectral Doppler, color Doppler, and power Doppler as needed of all accessible portions of each vessel. Bilateral testing is considered an integral part of a complete examination. Limited examinations for reoccurring indications may be performed as noted.  Duplex Findings: +--------------------+--------+--------+------+--------+ Mesenteric          PSV cm/sEDV cm/sPlaqueComments +--------------------+--------+--------+------+--------+ Aorta Mid             131      10                  +--------------------+--------+--------+------+--------+ Celiac Artery Origin  168                          +--------------------+--------+--------+------+--------+ SMA Mid               212      26                  +--------------------+--------+--------+------+--------+    +------------------+--------+--------+-------+ Right Renal ArteryPSV cm/sEDV cm/sComment  +------------------+--------+--------+-------+ Origin               48      13           +------------------+--------+--------+-------+ Proximal             57      14           +------------------+--------+--------+-------+ Mid                  49      10           +------------------+--------+--------+-------+ Distal               34      6            +------------------+--------+--------+-------+ +-----------------+--------+--------+-------+ Left Renal ArteryPSV cm/sEDV cm/sComment +-----------------+--------+--------+-------+ Origin  Physician Discharge Summary   Patient: ABBEE Boyer MRN: [id number] DOB: 07/29/1956  Admit date:     06/03/2023  Discharge date: 06/06/23  Discharge Physician: Kaitlin Boyer   PCP: Kaitlin Beam, MD   Recommendations at discharge:  Please obtain CBC and renal function on follow-up Please add insulin if needed for better control of diabetes, patient is currently on glipizide only, Jardiance was discontinued due to concern of worsening renal function Follow-up with primary care provider within a week Follow-up with nephrology  Discharge Diagnoses: Principal Problem:   Hypertensive emergency Active Problems:   Non-insulin treated type 2 diabetes mellitus (HCC)   AKI (acute kidney injury) (HCC)   CKD (chronic kidney disease) stage 3, GFR 30-59 ml/min (HCC)   Acute on chronic heart failure with preserved ejection fraction (HFpEF) (HCC)   Hyperlipidemia   Dizziness   Hospital Course: 67 year old female with CKD 3B b/l creatinine~1.3 on September 2023, hypertension, Chronic diastolic CHF,T2DM on metformin/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 In the ED alert awake oriented x 3, hypertensive with BP 208-242/72-108, heart rate 60s to 80s, not hypoxic. Labs with worsening of creatinine at 2, elevated BNP 421 troponin 14.  UA with proteinuria WBC 6-10.  CT head no acute finding but mild atrophy and chronic small vessel ischemic changes. Patient was given hydralazine 5 mg x 2. Admission requested for further management. In care-everywhere-she saw cardio Dr Kaitlin Boyer at [facility]  5/13 for  leg edema dyspnea w/ dizziness>meds placed on amlodipine 10, hydraalzine 50 tid, labetalol 200 tid, lasix 40 prn . But she is only taking omlesartan? TTE 02/06/23: LVEF 55-60% Moderate diastolic dysfunction [pseudonormal pattern] with elevated left atrial pressure.  CXR with no active cardiopulmonary  disease.  Patient was started on Cardene infusion and IV Lasix.  9/26: Blood pressure remained elevated at 198/81.  Labs with blood glucose of 287, CO2 18, mild pseudohyponatremia secondary to hyperglycemia, some improvement of creatinine to 1.8, Mild leukocytosis at 11, A1c 7.8, lipid panel with elevated total cholesterol, triglyceride and LDL of 104.  Adding Semglee 10 units twice daily and making SSI to moderate. Patient remained on Cardene infusion, renal arterial duplex was ordered. Echocardiogram ordered-pending  9/27: Blood pressure improving, able to wean off from Cardene infusion.  Echocardiogram with normal EF, moderate concentric LVH and mild mitral stenosis.  Renal arterial duplex with 1 to 59% of bilateral stenosis which is likely not the cause of her resistant hypertension. Slight worsening of creatinine-nephrology was consulted.  Adding amlodipine to her regimen.  9/28: Blood pressure improving, although still mildly elevated.  Nephrology also saw her and renal function currently seems stable.  Either this is a mild AKI with underlying CKD stage IIIb or she has progressed to CKD stage IV and nephrology will determine that as outpatient. Her Jardiance and olmesartan has been discontinued.  Patient is being discharged on glipizide for her diabetes, she might need insulin if need to add another agent and her PCP should be able to provide if needed.  She is being discharged on amlodipine 10 mg daily, labetalol 200 mg 3 times a day and hydralazine 50 mg 3 times a day for now.  Nephrology will have a close follow-up and manage her antihypertensives as outpatient.  Patient will continue on current medications and need to have a close follow-up with her providers for further recommendations.  Assessment and Plan: * Hypertensive  appointment as soon as possible for a visit in 1 week(s).   Specialty: Family Medicine Contact information: [address]  [address] [place] [address] [phone]                Discharge Exam: Ceasar Mons Weights   06/04/23 0707 06/05/23 0500 06/06/23 0730  Weight: 101.3 kg 100.2 kg 101 kg   General.  Well-developed lady, in no acute distress. Pulmonary.  Lungs clear bilaterally, normal respiratory effort. CV.  Regular rate and rhythm, no JVD, rub or murmur. Abdomen.  Soft, nontender, nondistended, BS positive. CNS.  Alert and oriented .  No focal neurologic deficit. Extremities.  No edema, no cyanosis, pulses  intact and symmetrical. Psychiatry.  Judgment and insight appears normal.   Condition at discharge: stable  The results of significant diagnostics from this hospitalization (including imaging, microbiology, ancillary and laboratory) are listed below for reference.   Imaging Studies: VAS US RENAL ARTERY DUPLEX  Result Date: 06/04/2023 ABDOMINAL VISCERAL Patient Name:  GRAYSON WHITE  Date of Exam:   06/04/2023 Medical Rec #: [id number]         Accession #:    [id number] Date of Birth: [birth year]        Patient Gender: F Patient Age:   65 years Exam Location:  [facility] Procedure:      VAS US RENAL ARTERY DUPLEX Referring Phys: [id number] Katlin Bortner -------------------------------------------------------------------------------- Indications: Resistant hypertension High Risk Factors: Hypertension, Diabetes. Limitations: Respiratory variation, patient movement. Comparison Study: No prior studies. Performing Technologist: Chanda Busing RVT  Examination Guidelines: A complete evaluation includes B-mode imaging, spectral Doppler, color Doppler, and power Doppler as needed of all accessible portions of each vessel. Bilateral testing is considered an integral part of a complete examination. Limited examinations for reoccurring indications may be performed as noted.  Duplex Findings: +--------------------+--------+--------+------+--------+ Mesenteric          PSV cm/sEDV cm/sPlaqueComments +--------------------+--------+--------+------+--------+ Aorta Mid             131      10                  +--------------------+--------+--------+------+--------+ Celiac Artery Origin  168                          +--------------------+--------+--------+------+--------+ SMA Mid               212      26                  +--------------------+--------+--------+------+--------+    +------------------+--------+--------+-------+ Right Renal ArteryPSV cm/sEDV cm/sComment  +------------------+--------+--------+-------+ Origin               48      13           +------------------+--------+--------+-------+ Proximal             57      14           +------------------+--------+--------+-------+ Mid                  49      10           +------------------+--------+--------+-------+ Distal               34      6            +------------------+--------+--------+-------+ +-----------------+--------+--------+-------+ Left Renal ArteryPSV cm/sEDV cm/sComment +-----------------+--------+--------+-------+ Origin  appointment as soon as possible for a visit in 1 week(s).   Specialty: Family Medicine Contact information: [address]  [address] [place] [address] [phone]                Discharge Exam: Ceasar Mons Weights   06/04/23 0707 06/05/23 0500 06/06/23 0730  Weight: 101.3 kg 100.2 kg 101 kg   General.  Well-developed lady, in no acute distress. Pulmonary.  Lungs clear bilaterally, normal respiratory effort. CV.  Regular rate and rhythm, no JVD, rub or murmur. Abdomen.  Soft, nontender, nondistended, BS positive. CNS.  Alert and oriented .  No focal neurologic deficit. Extremities.  No edema, no cyanosis, pulses  intact and symmetrical. Psychiatry.  Judgment and insight appears normal.   Condition at discharge: stable  The results of significant diagnostics from this hospitalization (including imaging, microbiology, ancillary and laboratory) are listed below for reference.   Imaging Studies: VAS US RENAL ARTERY DUPLEX  Result Date: 06/04/2023 ABDOMINAL VISCERAL Patient Name:  GRAYSON WHITE  Date of Exam:   06/04/2023 Medical Rec #: [id number]         Accession #:    [id number] Date of Birth: [birth year]        Patient Gender: F Patient Age:   65 years Exam Location:  [facility] Procedure:      VAS US RENAL ARTERY DUPLEX Referring Phys: [id number] Katlin Bortner -------------------------------------------------------------------------------- Indications: Resistant hypertension High Risk Factors: Hypertension, Diabetes. Limitations: Respiratory variation, patient movement. Comparison Study: No prior studies. Performing Technologist: Chanda Busing RVT  Examination Guidelines: A complete evaluation includes B-mode imaging, spectral Doppler, color Doppler, and power Doppler as needed of all accessible portions of each vessel. Bilateral testing is considered an integral part of a complete examination. Limited examinations for reoccurring indications may be performed as noted.  Duplex Findings: +--------------------+--------+--------+------+--------+ Mesenteric          PSV cm/sEDV cm/sPlaqueComments +--------------------+--------+--------+------+--------+ Aorta Mid             131      10                  +--------------------+--------+--------+------+--------+ Celiac Artery Origin  168                          +--------------------+--------+--------+------+--------+ SMA Mid               212      26                  +--------------------+--------+--------+------+--------+    +------------------+--------+--------+-------+ Right Renal ArteryPSV cm/sEDV cm/sComment  +------------------+--------+--------+-------+ Origin               48      13           +------------------+--------+--------+-------+ Proximal             57      14           +------------------+--------+--------+-------+ Mid                  49      10           +------------------+--------+--------+-------+ Distal               34      6            +------------------+--------+--------+-------+ +-----------------+--------+--------+-------+ Left Renal ArteryPSV cm/sEDV cm/sComment +-----------------+--------+--------+-------+ Origin  80      9            +-----------------+--------+--------+-------+ Proximal            61      6            +-----------------+--------+--------+-------+ Mid                 92      15           +-----------------+--------+--------+-------+ Distal              85      20           +-----------------+--------+--------+-------+ +------------+--------+--------+----+-----------+--------+--------+----+ Right KidneyPSV cm/sEDV cm/sRI  Left KidneyPSV cm/sEDV cm/sRI   +------------+--------+--------+----+-----------+--------+--------+----+ Upper Pole  21      5       0.75Upper Pole 37      7       0.82 +------------+--------+--------+----+-----------+--------+--------+----+ Mid         20      4       0.        37      7       0.81 +------------+--------+--------+----+-----------+--------+--------+----+ Lower Pole  47      7       0.84Lower Pole 38      6       0.84 +------------+--------+--------+----+-----------+--------+--------+----+ Hilar       31      5       0.83Hilar      59      13      0.78 +------------+--------+--------+----+-----------+--------+--------+----+ +------------------+-----+------------------+-----+ Right Kidney           Left Kidney             +------------------+-----+------------------+-----+ RAR                    RAR                      +------------------+-----+------------------+-----+ RAR (manual)      0.44 RAR (manual)      0.7   +------------------+-----+------------------+-----+ Cortex                 Cortex                  +------------------+-----+------------------+-----+ Cortex thickness       Corex thickness         +------------------+-----+------------------+-----+ Kidney length (cm)10.50Kidney length (cm)10.70 +------------------+-----+------------------+-----+  Summary: Renal:  Right: Normal size right kidney. Normal right Resisitive Index.        1-59% stenosis of the right renal artery. Left:  Normal size of left kidney. Normal left Resistive Index.        1-59% stenosis of the left renal artery.  *See table(s) above for measurements and observations.  Diagnosing physician: Kaitlin Boyer  Electronically signed by Kaitlin Boyer on 06/04/2023 at 5:09:44 PM.    Final    ECHOCARDIOGRAM COMPLETE  Result Date: 06/04/2023    ECHOCARDIOGRAM REPORT   Patient Name:   Kaitlin Boyer Date of Exam: 06/04/2023 Medical Rec #:  [id number]        Height:       77.0 in Accession #:    [id number]       Weight:       220.5 lb Date of Birth:  07-23-1956       BSA:

## 2023-08-09 DEATH — deceased

## 2023-09-05 IMAGING — CT CT HEAD W/O CM
3 series · 16 of 47 positions shown, 19 images · non-contrast
Comparison: 07/28/2020

CLINICAL DATA: Dizziness

EXAM:
CT HEAD WITHOUT CONTRAST
TECHNIQUE: Contiguous axial images were obtained from the base of the skull
through the vertex without intravenous contrast.

[Series 2: head wo · axial · 0.48mm/px · z∈[-278,-128]mm · 10 of 36 slices shown, 13 images]
[im 3/36  brain]
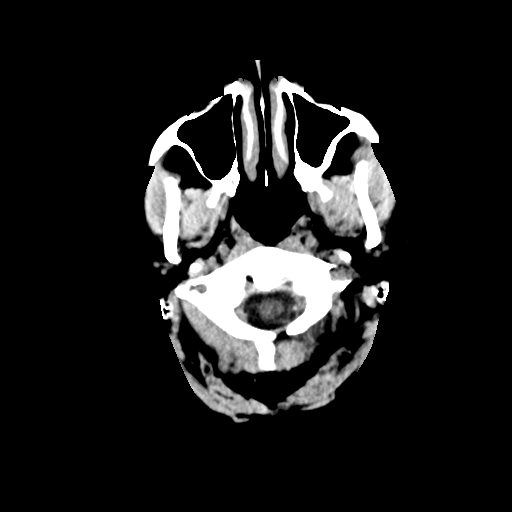
[im 3/36  bone]
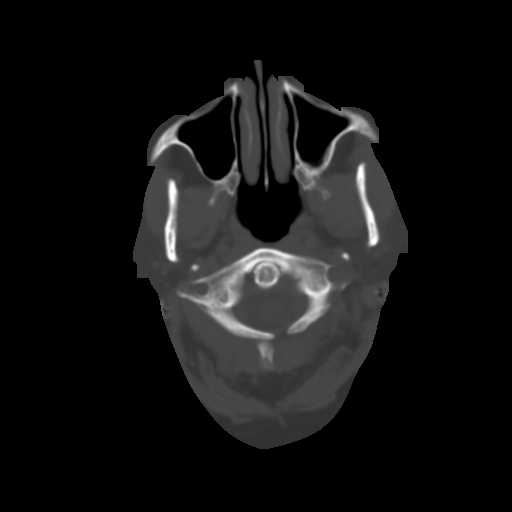
[im 7/36  brain]
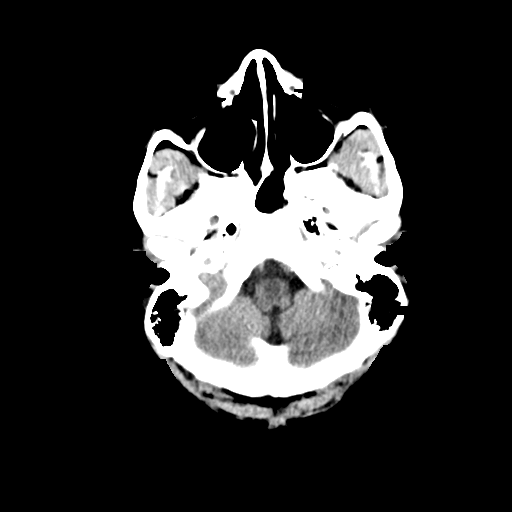
[im 10/36  brain]
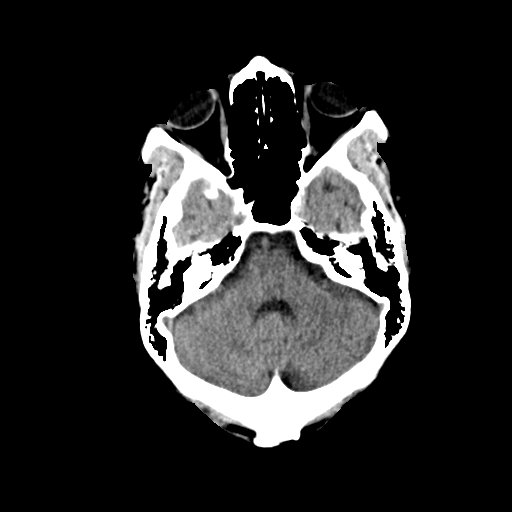
[im 13/36  brain]
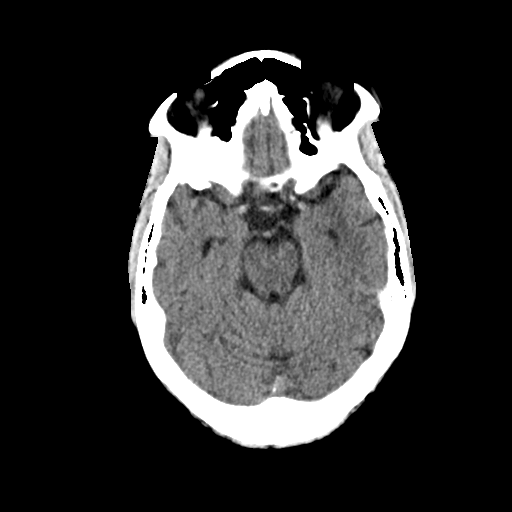
[im 16/36  brain]
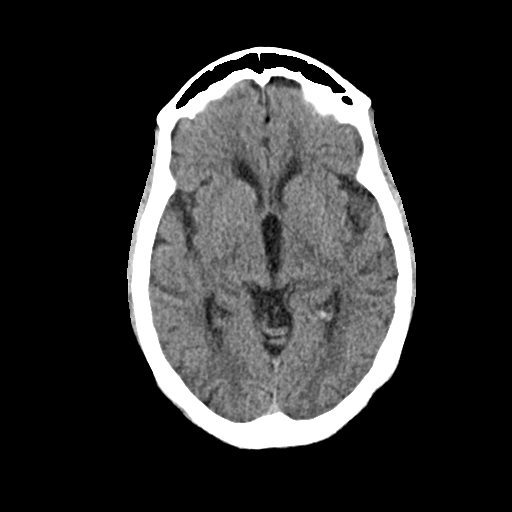
[im 16/36  bone]
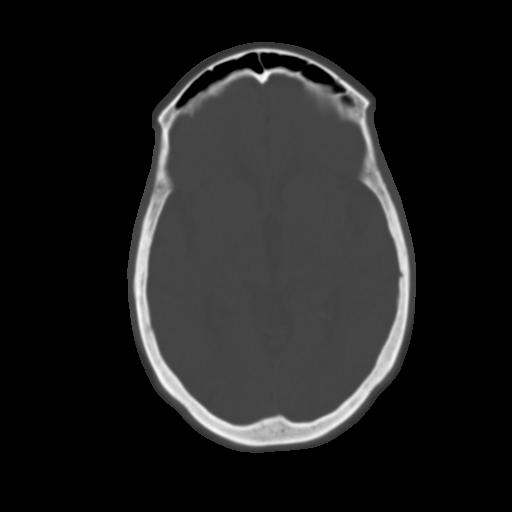
[im 20/36  brain]
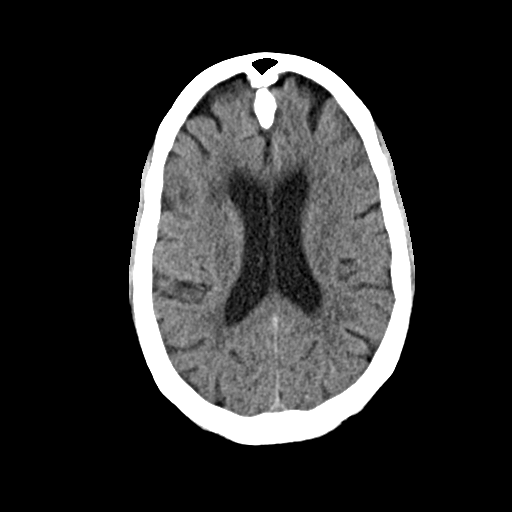
[im 23/36  brain]
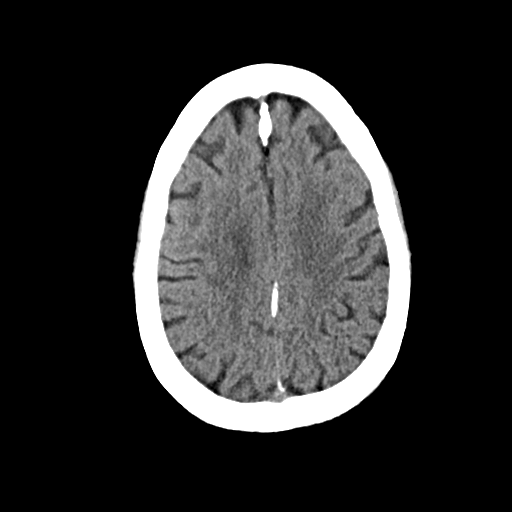
[im 27/36  brain]
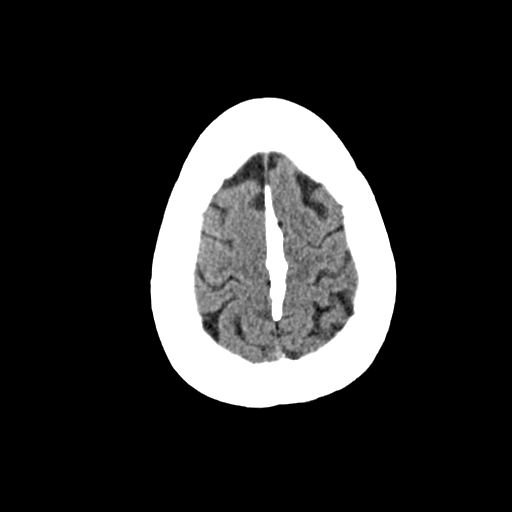
[im 29/36  brain]
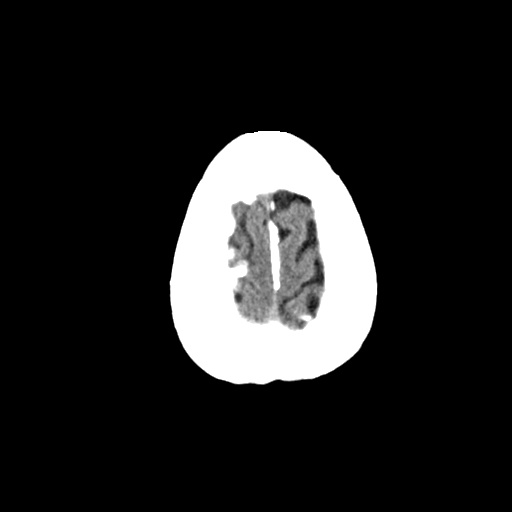
[im 29/36  bone]
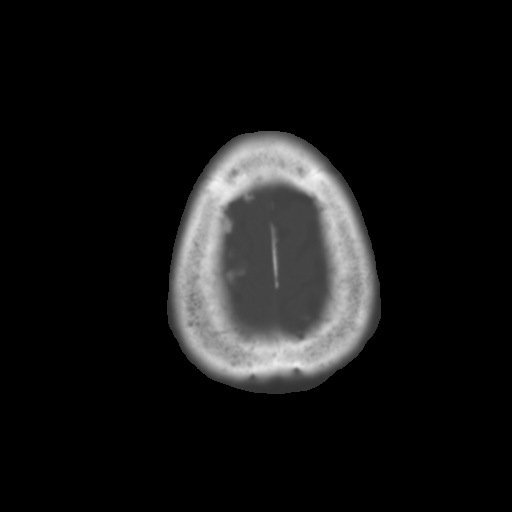
[im 33/36  brain]
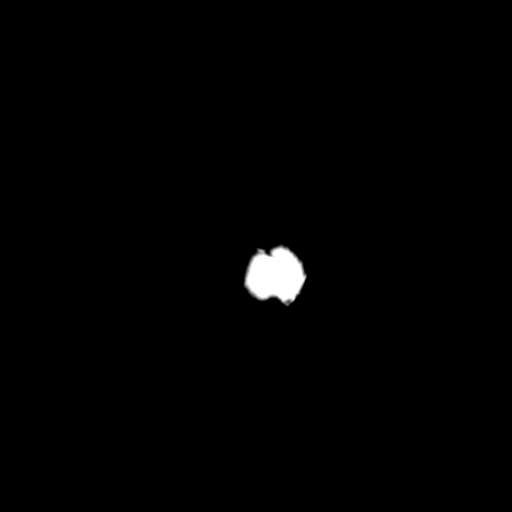

[Series 4: coronal soft · coronal · 0.35mm/px · 3 of 69 slices shown]
[im 23/69  brain]
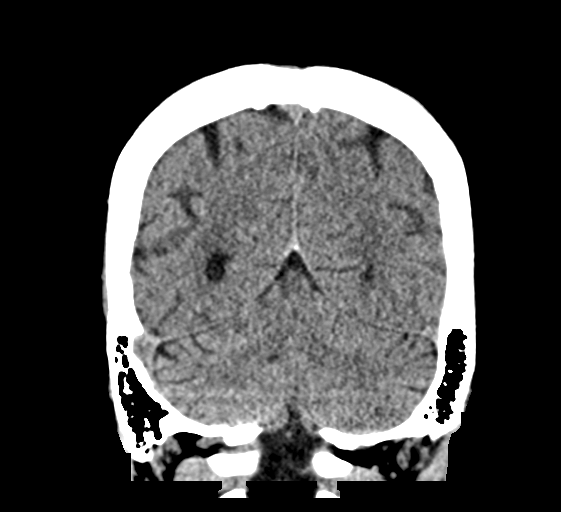
[im 31/69  brain]
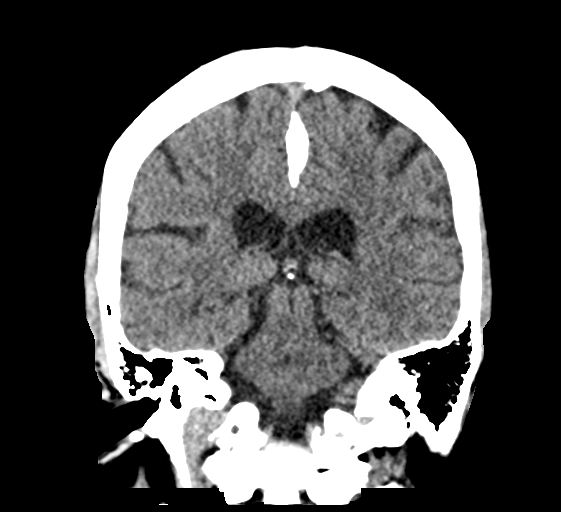
[im 38/69  brain]
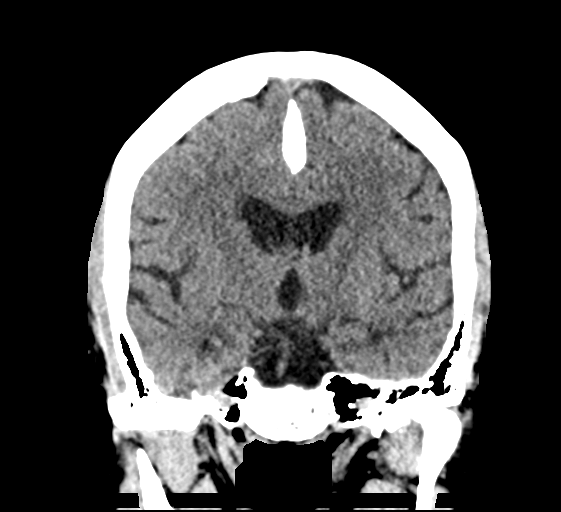

[Series 5: sag soft · sagittal · 0.35mm/px · 3 of 67 slices shown]
[im 23/67  brain]
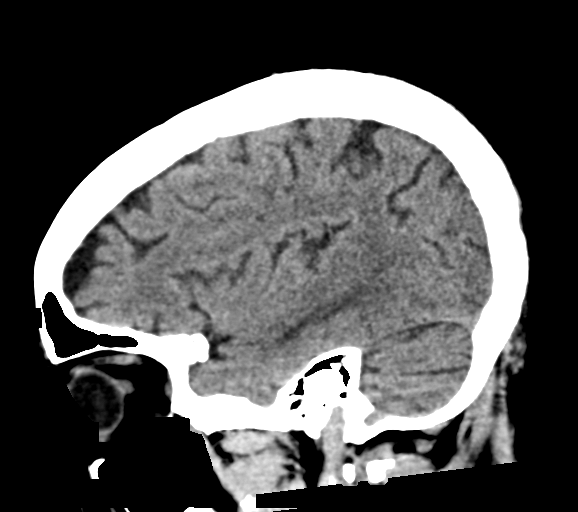
[im 34/67  brain]
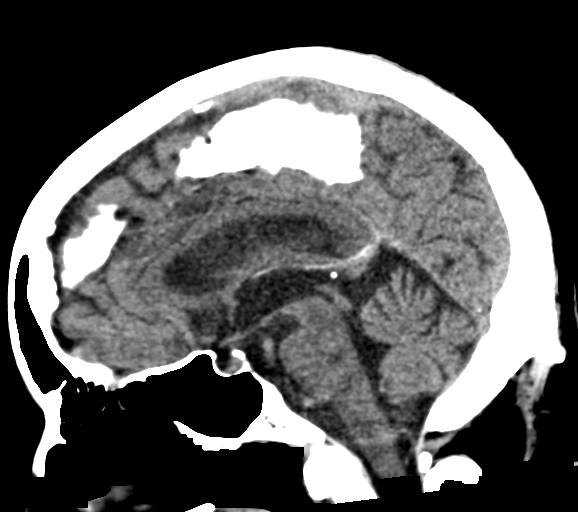
[im 45/67  brain]
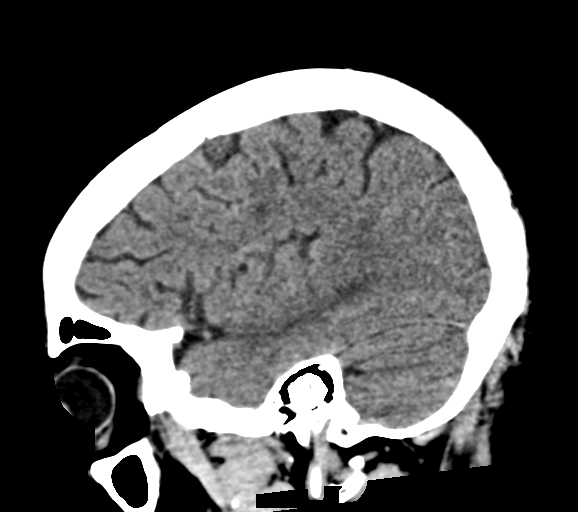

[16 of 47 positions shown; findings below may reference images not displayed]

FINDINGS: Brain: Normal anatomic configuration. Parenchymal volume loss is
commensurate with the patient's age. Stable mild periventricular
white matter changes are present likely reflecting the sequela of
small vessel ischemia. No abnormal intra or extra-axial mass lesion
or fluid collection. No abnormal mass effect or midline shift. No
evidence of acute intracranial hemorrhage or infarct. Ventricular
size is normal. Cerebellum unremarkable.

Vascular: No asymmetric hyperdense vasculature at the skull base.

Skull: Intact

Sinuses/Orbits: Paranasal sinuses are clear. Orbits are
unremarkable.

Other: Mastoid air cells and middle ear cavities are clear.
IMPRESSION: No acute intracranial abnormality.  Stable senescent change.
